# Patient Record
Sex: Female | Born: 1985
Health system: Southern US, Community
[De-identification: ages and names within clinical notes are randomized; demographics above are authoritative.]

## PROBLEM LIST (undated history)

## (undated) DIAGNOSIS — Z789 Other specified health status: Secondary | ICD-10-CM

## (undated) DIAGNOSIS — T7840XA Allergy, unspecified, initial encounter: Secondary | ICD-10-CM

## (undated) DIAGNOSIS — D649 Anemia, unspecified: Secondary | ICD-10-CM

## (undated) HISTORY — PX: WISDOM TOOTH EXTRACTION: SHX21

## (undated) HISTORY — DX: Anemia, unspecified: D64.9

## (undated) HISTORY — DX: Allergy, unspecified, initial encounter: T78.40XA

## (undated) HISTORY — DX: Other specified health status: Z78.9

---

## 2015-07-23 LAB — CBC AND DIFFERENTIAL
HCT: 37 (ref 36–46)
HEMATOCRIT: 37 (ref 36–46)
Hemoglobin: 12.9 (ref 12.0–16.0)
Hemoglobin: 12.9 (ref 12.0–16.0)
Neutrophils Absolute: 6
Platelets: 211 (ref 150–399)
Platelets: 211 (ref 150–399)
WBC: 8.3
WBC: 8.3

## 2015-07-23 LAB — HM PAP SMEAR: HM PAP: NEGATIVE

## 2015-07-23 LAB — HIV ANTIBODY (ROUTINE TESTING W REFLEX): HIV: NONREACTIVE

## 2015-08-18 LAB — POCT URINALYSIS DIPSTICK

## 2016-10-28 ENCOUNTER — Ambulatory Visit (INDEPENDENT_AMBULATORY_CARE_PROVIDER_SITE_OTHER): Payer: PRIVATE HEALTH INSURANCE | Admitting: Adult Health

## 2016-10-28 ENCOUNTER — Encounter: Payer: Self-pay | Admitting: Adult Health

## 2016-10-28 VITALS — BP 105/68 | HR 109 | Ht 70.0 in | Wt 135.1 lb

## 2016-10-28 DIAGNOSIS — O26819 Pregnancy related exhaustion and fatigue, unspecified trimester: Secondary | ICD-10-CM

## 2016-10-28 DIAGNOSIS — M25521 Pain in right elbow: Secondary | ICD-10-CM | POA: Diagnosis not present

## 2016-10-28 DIAGNOSIS — Z0000 Encounter for general adult medical examination without abnormal findings: Secondary | ICD-10-CM | POA: Diagnosis not present

## 2016-10-28 DIAGNOSIS — Z1389 Encounter for screening for other disorder: Secondary | ICD-10-CM | POA: Insufficient documentation

## 2016-10-28 NOTE — Patient Instructions (Signed)
Heart-Healthy Eating Plan Many factors influence your heart health, including eating and exercise habits. Heart (coronary) risk increases with abnormal blood fat (lipid) levels. Heart-healthy meal planning includes limiting unhealthy fats, increasing healthy fats, and making other small dietary changes. This includes maintaining a healthy body weight to help keep lipid levels within a normal range. What is my plan? Your health care provider recommends that you:  Get no more than __25__% of the total calories in your daily diet from fat.  Limit your intake of saturated fat to less than __5__% of your total calories each day.  Limit the amount of cholesterol in your diet to less than __300__ mg per day.  What types of fat should I choose?  Choose healthy fats more often. Choose monounsaturated and polyunsaturated fats, such as olive oil and canola oil, flaxseeds, walnuts, almonds, and seeds.  Eat more omega-3 fats. Good choices include salmon, mackerel, sardines, tuna, flaxseed oil, and ground flaxseeds. Aim to eat fish at least two times each week.  Limit saturated fats. Saturated fats are primarily found in animal products, such as meats, butter, and cream. Plant sources of saturated fats include palm oil, palm kernel oil, and coconut oil.  Avoid foods with partially hydrogenated oils in them. These contain trans fats. Examples of foods that contain trans fats are stick margarine, some tub margarines, cookies, crackers, and other baked goods. What general guidelines do I need to follow?  Check food labels carefully to identify foods with trans fats or high amounts of saturated fat.  Fill one half of your plate with vegetables and green salads. Eat 4-5 servings of vegetables per day. A serving of vegetables equals 1 cup of raw leafy vegetables,  cup of raw or cooked cut-up vegetables, or  cup of vegetable juice.  Fill one fourth of your plate with whole grains. Look for the word "whole" as  the first word in the ingredient list.  Fill one fourth of your plate with lean protein foods.  Eat 4-5 servings of fruit per day. A serving of fruit equals one medium whole fruit,  cup of dried fruit,  cup of fresh, frozen, or canned fruit, or  cup of 100% fruit juice.  Eat more foods that contain soluble fiber. Examples of foods that contain this type of fiber are apples, broccoli, carrots, beans, peas, and barley. Aim to get 20-30 g of fiber per day.  Eat more home-cooked food and less restaurant, buffet, and fast food.  Limit or avoid alcohol.  Limit foods that are high in starch and sugar.  Avoid fried foods.  Cook foods by using methods other than frying. Baking, boiling, grilling, and broiling are all great options. Other fat-reducing suggestions include: ? Removing the skin from poultry. ? Removing all visible fats from meats. ? Skimming the fat off of stews, soups, and gravies before serving them. ? Steaming vegetables in water or broth.  Lose weight if you are overweight. Losing just 5-10% of your initial body weight can help your overall health and prevent diseases such as diabetes and heart disease.  Increase your consumption of nuts, legumes, and seeds to 4-5 servings per week. One serving of dried beans or legumes equals  cup after being cooked, one serving of nuts equals 1 ounces, and one serving of seeds equals  ounce or 1 tablespoon.  You may need to monitor your salt (sodium) intake, especially if you have high blood pressure. Talk with your health care provider or dietitian to get  more information about reducing sodium. What foods can I eat? Grains  Breads, including [place], white, pita, wheat, raisin, rye, oatmeal, and [place]. Tortillas that are neither fried nor made with lard or trans fat. Low-fat rolls, including hotdog and hamburger buns and English muffins. Biscuits. Muffins. Waffles. Pancakes. Light popcorn. Whole-grain cereals. Flatbread. Melba toast.  Pretzels. Breadsticks. Rusks. Low-fat snacks and crackers, including oyster, saltine, matzo, graham, animal, and rye. Rice and pasta, including brown rice and those that are made with whole wheat. Vegetables All vegetables. Fruits All fruits, but limit coconut. Meats and Other Protein Sources Lean, well-trimmed beef, veal, pork, and lamb. Chicken and [place] without skin. All fish and shellfish. Wild duck, rabbit, pheasant, and venison. Egg whites or low-cholesterol egg substitutes. Dried beans, peas, lentils, and tofu.Seeds and most nuts. Dairy Low-fat or nonfat cheeses, including ricotta, string, and mozzarella. Skim or 1% milk that is liquid, powdered, or evaporated. Buttermilk that is made with low-fat milk. Nonfat or low-fat yogurt. Beverages Mineral water. Diet carbonated beverages. Sweets and Desserts Sherbets and fruit ices. Honey, jam, marmalade, jelly, and syrups. Meringues and gelatins. Pure sugar candy, such as hard candy, jelly beans, gumdrops, mints, marshmallows, and small amounts of dark chocolate. [employer] Eat all sweets and desserts in moderation. Fats and Oils Nonhydrogenated (trans-free) margarines. Vegetable oils, including soybean, sesame, sunflower, olive, peanut, safflower, corn, canola, and cottonseed. Salad dressings or mayonnaise that are made with a vegetable oil. Limit added fats and oils that you use for cooking, baking, salads, and as spreads. Other Cocoa powder. Coffee and tea. All seasonings and condiments. The items listed above may not be a complete list of recommended foods or beverages. Contact your dietitian for more options. What foods are not recommended? Grains Breads that are made with saturated or trans fats, oils, or whole milk. Croissants. Butter rolls. Cheese breads. Sweet rolls. Donuts. Buttered popcorn. Chow mein noodles. High-fat crackers, such as cheese or butter crackers. Meats and Other Protein Sources Fatty meats, such as hotdogs,  short ribs, sausage, spareribs, bacon, ribeye roast or steak, and mutton. High-fat deli meats, such as salami and bologna. Caviar. Domestic duck and goose. Organ meats, such as kidney, liver, sweetbreads, brains, gizzard, chitterlings, and heart. Dairy Cream, sour cream, cream cheese, and creamed cottage cheese. Whole milk cheeses, including blue (bleu), Monte[place] Jack, [place], [place], American, [place], Swiss, [place], [place], and [place]. Whole or 2% milk that is liquid, evaporated, or condensed. Whole buttermilk. Cream sauce or high-fat cheese sauce. Yogurt that is made from whole milk. Beverages Regular sodas and drinks with added sugar. Sweets and Desserts Frosting. Pudding. Cookies. Cakes other than angel food cake. Candy that has milk chocolate or white chocolate, hydrogenated fat, butter, coconut, or unknown ingredients. Buttered syrups. Full-fat ice cream or ice cream drinks. Fats and Oils Gravy that has suet, meat fat, or shortening. Cocoa butter, hydrogenated oils, palm oil, coconut oil, palm kernel oil. These can often be found in baked products, candy, fried foods, nondairy creamers, and whipped toppings. Solid fats and shortenings, including bacon fat, salt pork, lard, and butter. Nondairy cream substitutes, such as coffee creamers and sour cream substitutes. Salad dressings that are made of unknown oils, cheese, or sour cream. The items listed above may not be a complete list of foods and beverages to avoid. Contact your dietitian for more information. This information is not intended to replace advice given to you by your health care provider. Make sure you discuss any questions you have with your health care  provider. Document Released: 10/14/2007 Document Revised: 07/25/2015 Document Reviewed: 06/28/2013 Elsevier Interactive Patient Education  2017 [employer].  Overall you are doing great!!! Please schedule fasting labs at your convenience and full physical this fall. Referral to  OB/GYN placed. WELCOME TO THE PRACTICE!

## 2016-10-28 NOTE — Assessment & Plan Note (Addendum)
Apply ice for 20 mins several times daily Use OTC elbow sleeve Alternate holding son between each arm, not to over use R elbow.

## 2016-10-28 NOTE — Assessment & Plan Note (Addendum)
Continue excellent hydration and healthy eating. Will monitor wt, 1 degree family hx negative for ca. Please schedule fasting labs at your convenience and full physical this fall. Referral to OB/GYN placed.

## 2016-10-28 NOTE — Progress Notes (Signed)
Subjective:    Patient ID: [name], female    DOB: 1985/09/14, 31 y.o.   MRN: [id number]  HPI"  [name] is here to establish as a new pt.  She is a very pleasant 31 year female.  PMH:  Denies chronic medical conditions/daily rx medications.  She is a mother to a 60 month old- still breastfeeding. She feels that her overall health is good and has a a few concerns: 1) Unexpected wt loss- she is 10lbs under pre-birth wt and she is not trying to diet/loss wt.  She is breastfeeding everyday and eating less than her usual amt of food-motherhood!  1 degree family hx negative for ca. She denies fever/night sweats/NV/D 2) R elbow tenderness r/t to holding 20 lb son.  She has been using OTC "squeezer brace" on R FA- has not reduced pain. 3) Sharp sternal burning CP that often develops after eating.  She reports sx's will self resolve <1 min.  She denies 1 degree family hx of CAD/MI/HTN.  Patient Care Team    Relationship Specialty Notifications Start End  Julaine Fusi, NP PCP - General Family Medicine  10/28/16     Patient Active Problem List   Diagnosis Date Noted  . Healthcare maintenance 10/28/2016  . Right elbow pain 10/28/2016     History reviewed. No pertinent past medical history.   Past Surgical History:  Procedure Laterality Date  . CESAREAN SECTION    . WISDOM TOOTH EXTRACTION       Family History  Problem Relation Age of Onset  . Healthy Mother   . Healthy Father   . Healthy Sister   . Heart attack Maternal Grandfather   . Hyperlipidemia Maternal Grandfather   . Hypertension Maternal Grandfather   . Cancer Paternal Grandmother        lung  . Alcohol abuse Paternal Grandfather   . Healthy Sister      History  Drug Use No     History  Alcohol Use  . 0.6 oz/week  . 1 Glasses of wine per week     History  Smoking Status  . Former Smoker  . Packs/day: 0.25  . Years: 1.00  . Types: Cigarettes  . Quit date: 01/19/2008  Smokeless Tobacco   . Never Used     Outpatient Encounter Prescriptions as of 10/28/2016  Medication Sig  . Calcium Carb-Cholecalciferol (CALCIUM 1000 + D PO) Take 1 tablet by mouth 3 (three) times a week.  . ferrous gluconate (FERGON) 240 (27 FE) MG tablet Take 240 mg by mouth 3 (three) times a week.  Marland Kitchen levonorgestrel (MIRENA) 20 MCG/24HR IUD 1 each by Intrauterine route once.  . Multiple Vitamin (MULTIVITAMIN) tablet Take 1 tablet by mouth daily.   No facility-administered encounter medications on file as of 10/28/2016.     Allergies: Patient has no known allergies.  Body mass index is 19.38 kg/m.  Blood pressure 105/68, pulse (!) 109, height  (1.778 m), weight 135 lb 1.6 oz (61.3 kg).     Review of Systems  Constitutional: Positive for fatigue and unexpected weight change. Negative for activity change, appetite change, chills, diaphoresis and fever.  HENT: Negative for congestion.   Eyes: Negative for visual disturbance.  Respiratory: Positive for chest tightness. Negative for cough, shortness of breath, wheezing and stridor.   Cardiovascular: Negative for chest pain, palpitations and leg swelling.  Gastrointestinal: Negative for abdominal distention, abdominal pain, blood in stool, constipation, diarrhea, nausea and vomiting.  Occasional "gas pains"  Endocrine: Negative for cold intolerance, heat intolerance, polydipsia, polyphagia and polyuria.  Genitourinary: Negative for difficulty urinating, flank pain and hematuria.  Musculoskeletal: Positive for arthralgias. Negative for back pain, gait problem, joint swelling, myalgias, neck pain and neck stiffness.  Skin: Negative for color change, pallor, rash and wound.  Neurological: Negative for dizziness, tremors, weakness and headaches.  Hematological: Does not bruise/bleed easily.  Psychiatric/Behavioral: Negative for decreased concentration, hallucinations, self-injury, sleep disturbance and suicidal ideas. The patient is not  nervous/anxious and is not hyperactive.        Objective:   Physical Exam  Constitutional: She is oriented to person, place, and time. She appears well-developed and well-nourished. No distress.  HENT:  Head: Normocephalic and atraumatic.  Right Ear: External ear normal.  Left Ear: External ear normal.  Eyes: Pupils are equal, round, and reactive to light. Conjunctivae are normal.  Cardiovascular: Regular rhythm, normal heart sounds and intact distal pulses.  Tachycardia present.   No murmur heard. Pulmonary/Chest: Effort normal and breath sounds normal. No respiratory distress. She has no wheezes. She has no rales. She exhibits no tenderness.  Musculoskeletal: She exhibits tenderness.       Right shoulder: Normal.       Right elbow: She exhibits normal range of motion. Tenderness found.       Right wrist: Normal.  Neurological: She is alert and oriented to person, place, and time.  Skin: Skin is warm and dry. No rash noted. She is not diaphoretic. No erythema. No pallor.  Psychiatric: She has a normal mood and affect. Her behavior is normal. Judgment and thought content normal.  Nursing note and vitals reviewed.         Assessment & Plan:   1. Healthcare maintenance   2. Pregnancy related fatigue, antepartum   3. Right elbow pain     Healthcare maintenance Continue excellent hydration and healthy eating. Will monitor wt, 1 degree family hx negative for ca. Please schedule fasting labs at your convenience and full physical this fall. Referral to OB/GYN placed.   Right elbow pain Apply ice for 20 mins several times daily Use OTC elbow sleeve Alternate holding son between each arm, not to over use R elbow.    FOLLOW-UP:  Return in about 3 months (around 01/28/2017) for CPE.

## 2016-11-03 ENCOUNTER — Other Ambulatory Visit: Payer: PRIVATE HEALTH INSURANCE

## 2016-11-05 ENCOUNTER — Other Ambulatory Visit (INDEPENDENT_AMBULATORY_CARE_PROVIDER_SITE_OTHER): Payer: PRIVATE HEALTH INSURANCE

## 2016-11-05 VITALS — BP 100/62 | HR 87 | Temp 97.9°F

## 2016-11-05 DIAGNOSIS — Z23 Encounter for immunization: Secondary | ICD-10-CM | POA: Diagnosis not present

## 2016-11-05 DIAGNOSIS — O26819 Pregnancy related exhaustion and fatigue, unspecified trimester: Secondary | ICD-10-CM

## 2016-11-05 DIAGNOSIS — Z0000 Encounter for general adult medical examination without abnormal findings: Secondary | ICD-10-CM

## 2016-11-05 NOTE — Progress Notes (Signed)
Pt here for influenza vaccine.  Screening questionnaire reviewed, VIS provided to patient, and any/all patient questions answered.  T. Nelson, CMA  

## 2016-11-05 NOTE — Addendum Note (Signed)
Addended by: Stan HeadNELSON, TONYA S on: 11/05/2016 10:13 AM   Modules accepted: Orders

## 2016-11-06 LAB — CBC WITH DIFFERENTIAL/PLATELET
BASOS: 0 %
Basophils Absolute: 0 10*3/uL (ref 0.0–0.2)
EOS (ABSOLUTE): 0.1 10*3/uL (ref 0.0–0.4)
EOS: 2 %
HEMATOCRIT: 39.3 % (ref 34.0–46.6)
Hemoglobin: 12.8 g/dL (ref 11.1–15.9)
IMMATURE GRANULOCYTES: 0 %
Immature Grans (Abs): 0 10*3/uL (ref 0.0–0.1)
Lymphocytes Absolute: 2 10*3/uL (ref 0.7–3.1)
Lymphs: 37 %
MCH: 30.1 pg (ref 26.6–33.0)
MCHC: 32.6 g/dL (ref 31.5–35.7)
MCV: 93 fL (ref 79–97)
MONOS ABS: 0.3 10*3/uL (ref 0.1–0.9)
Monocytes: 5 %
NEUTROS ABS: 3 10*3/uL (ref 1.4–7.0)
NEUTROS PCT: 56 %
Platelets: 191 10*3/uL (ref 150–379)
RBC: 4.25 x10E6/uL (ref 3.77–5.28)
RDW: 14 % (ref 12.3–15.4)
WBC: 5.4 10*3/uL (ref 3.4–10.8)

## 2016-11-06 LAB — COMPREHENSIVE METABOLIC PANEL
A/G RATIO: 2.1 (ref 1.2–2.2)
ALT: 30 IU/L (ref 0–32)
AST: 25 IU/L (ref 0–40)
Albumin: 4.8 g/dL (ref 3.5–5.5)
Alkaline Phosphatase: 105 IU/L (ref 39–117)
BILIRUBIN TOTAL: 1.1 mg/dL (ref 0.0–1.2)
BUN/Creatinine Ratio: 18 (ref 9–23)
BUN: 15 mg/dL (ref 6–20)
CALCIUM: 9.6 mg/dL (ref 8.7–10.2)
CHLORIDE: 101 mmol/L (ref 96–106)
CO2: 24 mmol/L (ref 20–29)
Creatinine, Ser: 0.85 mg/dL (ref 0.57–1.00)
GFR, EST AFRICAN AMERICAN: 106 mL/min/{1.73_m2} (ref >59)
GFR, EST NON AFRICAN AMERICAN: 92 mL/min/{1.73_m2} (ref >59)
GLOBULIN, TOTAL: 2.3 g/dL (ref 1.5–4.5)
Glucose: 77 mg/dL (ref 65–99)
POTASSIUM: 4.2 mmol/L (ref 3.5–5.2)
Sodium: 140 mmol/L (ref 134–144)
TOTAL PROTEIN: 7.1 g/dL (ref 6.0–8.5)

## 2016-11-06 LAB — LIPID PANEL
Chol/HDL Ratio: 2.1 ratio (ref 0.0–4.4)
Cholesterol, Total: 145 mg/dL (ref 100–199)
HDL: 69 mg/dL (ref >39)
LDL CALC: 69 mg/dL (ref 0–99)
Triglycerides: 37 mg/dL (ref 0–149)
VLDL CHOLESTEROL CAL: 7 mg/dL (ref 5–40)

## 2016-11-06 LAB — HEMOGLOBIN A1C
Est. average glucose Bld gHb Est-mCnc: 100 mg/dL
Hgb A1c MFr Bld: 5.1 % (ref 4.8–5.6)

## 2016-11-06 LAB — TSH: TSH: 1.41 u[IU]/mL (ref 0.450–4.500)

## 2016-11-06 LAB — VITAMIN D 25 HYDROXY (VIT D DEFICIENCY, FRACTURES): VIT D 25 HYDROXY: 31.4 ng/mL (ref 30.0–100.0)

## 2017-02-01 NOTE — Progress Notes (Signed)
Subjective:    Patient ID: [name], female    DOB: 06/11/1985, 32 y.o.   MRN: [id number]  HPI: 10/28/16 OV:  [name] is here to establish as a new pt.  She is a very pleasant 32 year female.  PMH:  Denies chronic medical conditions/daily rx medications.  She is a mother to a 30 month old- still breastfeeding. She feels that her overall health is good and has a a few concerns: 1) Unexpected wt loss- she is 10lbs under pre-birth wt and she is not trying to diet/loss wt.  She is breastfeeding everyday and eating less than her usual amt of food-motherhood!  1 degree family hx negative for ca. She denies fever/night sweats/NV/D 2) R elbow tenderness r/t to holding 20 lb son.  She has been using OTC "squeezer brace" on R FA- has not reduced pain. 3) Sharp sternal burning CP that often develops after eating.  She reports sx's will self resolve <1 min.  She denies 1 degree family hx of CAD/MI/HTN.  02/02/17 OV: [name] is here for CPE. She has gained 7 lbs since last OV- GREAT! She is still breast feeding 3 times/day. She follows heart healthy diet and drinks 6-8 glasses water/day. She recently started walking program, 1-2 miles/day. Sternal CP has not occurred in months. Only concern today is erratic behavior and paranoid tendencies of her mother in law.  She is unaware of hx of mental illness in her husband's family.    Healthcare Maintenance: PAP- completed with OB/GY, not due for repeat Spring 2019 Mammogram-not indicated Colonoscopy-not indicated Influenza UTD  Patient Care Team    Relationship Specialty Notifications Start End  Julaine Fusi, NP PCP - General Family Medicine  10/28/16     Patient Active Problem List   Diagnosis Date Noted  . Stress at home 02/02/2017  . Healthcare maintenance 10/28/2016  . Right elbow pain 10/28/2016     History reviewed. No pertinent past medical history.   Past Surgical History:  Procedure Laterality Date  .  CESAREAN SECTION    . WISDOM TOOTH EXTRACTION       Family History  Problem Relation Age of Onset  . Healthy Mother   . Healthy Father   . Healthy Sister   . Heart attack Maternal Grandfather   . Hyperlipidemia Maternal Grandfather   . Hypertension Maternal Grandfather   . Cancer Paternal Grandmother        lung  . Alcohol abuse Paternal Grandfather   . Healthy Sister      Social History   Substance and Sexual Activity  Drug Use No     Social History   Substance and Sexual Activity  Alcohol Use Yes  . Alcohol/week: 0.6 oz  . Types: 1 Glasses of wine per week     Social History   Tobacco Use  Smoking Status Former Smoker  . Packs/day: 0.25  . Years: 1.00  . Pack years: 0.25  . Types: Cigarettes  . Last attempt to quit: 01/19/2008  . Years since quitting: 9.0  Smokeless Tobacco Never Used     Outpatient Encounter Medications as of 02/02/2017  Medication Sig  . Calcium Carb-Cholecalciferol (CALCIUM 1000 + D PO) Take 1 tablet by mouth 3 (three) times a week.  . ferrous gluconate (FERGON) 240 (27 FE) MG tablet Take 240 mg by mouth 3 (three) times a week.  Marland Kitchen levonorgestrel (MIRENA) 20 MCG/24HR IUD 1 each by Intrauterine route once.  . Multiple  Vitamin (MULTIVITAMIN) tablet Take 1 tablet by mouth daily.   No facility-administered encounter medications on file as of 02/02/2017.     Allergies: Patient has no known allergies.  Body mass index is 20.43 kg/m.  Blood pressure 106/71, pulse 98, height 5\' 10"  (1.778 m), weight 142 lb 6.4 oz (64.6 kg), SpO2 98 %.     Review of Systems  Constitutional: Positive for fatigue. Negative for activity change, appetite change, chills, diaphoresis, fever and unexpected weight change.  HENT: Negative for congestion.   Eyes: Negative for visual disturbance.  Respiratory: Negative for cough, chest tightness, shortness of breath, wheezing and stridor.   Cardiovascular: Negative for chest pain, palpitations and leg swelling.   Gastrointestinal: Negative for abdominal distention, abdominal pain, blood in stool, constipation, diarrhea, nausea and vomiting.  Endocrine: Negative for cold intolerance, heat intolerance, polydipsia, polyphagia and polyuria.  Genitourinary: Negative for difficulty urinating, flank pain and hematuria.  Musculoskeletal: Negative for arthralgias, back pain, gait problem, joint swelling, myalgias, neck pain and neck stiffness.  Skin: Negative for color change, pallor, rash and wound.  Neurological: Negative for dizziness, tremors, weakness and headaches.  Hematological: Does not bruise/bleed easily.  Psychiatric/Behavioral: Negative for decreased concentration, hallucinations, self-injury, sleep disturbance and suicidal ideas. The patient is not nervous/anxious and is not hyperactive.        Objective:   Physical Exam  Constitutional: She is oriented to person, place, and time. She appears well-developed and well-nourished. No distress.  HENT:  Head: Normocephalic and atraumatic.  Right Ear: Hearing, tympanic membrane, external ear and ear canal normal. Tympanic membrane is not erythematous and not bulging. No decreased hearing is noted.  Left Ear: Hearing, tympanic membrane, external ear and ear canal normal. Tympanic membrane is not erythematous and not bulging. No decreased hearing is noted.  Nose: Right sinus exhibits no maxillary sinus tenderness and no frontal sinus tenderness. Left sinus exhibits no maxillary sinus tenderness and no frontal sinus tenderness.  Mouth/Throat: Uvula is midline, oropharynx is clear and moist and mucous membranes are normal.  Eyes: Conjunctivae are normal. Pupils are equal, round, and reactive to light.  Neck: Normal range of motion.  Cardiovascular: Regular rhythm, normal heart sounds and intact distal pulses. Tachycardia present.  No murmur heard. Pulmonary/Chest: Effort normal and breath sounds normal. No respiratory distress. She has no wheezes. She has  no rales. She exhibits no tenderness.  Abdominal: Soft. Bowel sounds are normal. She exhibits no distension and no mass. There is no tenderness. There is no rebound and no guarding.  Musculoskeletal: She exhibits no tenderness.       Right shoulder: Normal.       Right elbow: Normal.      Right wrist: Normal.  Lymphadenopathy:    She has no cervical adenopathy.  Neurological: She is alert and oriented to person, place, and time.  Skin: Skin is warm and dry. No rash noted. She is not diaphoretic. No erythema. No pallor.  Psychiatric: She has a normal mood and affect. Her behavior is normal. Judgment and thought content normal.  Nursing note and vitals reviewed.         Assessment & Plan:   1. Stress at home   2. Healthcare maintenance     Healthcare maintenance You are doing great! Reviewed all labs from 10/2016, all WNL Continue your excellent water intake, healthy eating, and regular walking. Referral to mental health placed for you. Follow-up with OB/GYN for regular care, IUD removal when needed. Annual physical with labs. Call  us if you need anything!  Stress at home R/t mother in laws's erratic/paranoid behavior that has been escalating the last few years. She is unaware of any mental illness in husband's family. Referral placed for her to speak to therapist about these concerns and life in general. Recommended her to bring her concerns to her husband/family and that her mother in law should be evaluated by psychiatrist.     FOLLOW-UP:  Return in about 1 year (around 02/02/2018) for CPE, Fasting Labs.

## 2017-02-02 ENCOUNTER — Ambulatory Visit (INDEPENDENT_AMBULATORY_CARE_PROVIDER_SITE_OTHER): Payer: PRIVATE HEALTH INSURANCE | Admitting: Adult Health

## 2017-02-02 ENCOUNTER — Encounter: Payer: Self-pay | Admitting: Adult Health

## 2017-02-02 VITALS — BP 106/71 | HR 98 | Ht 70.0 in | Wt 142.4 lb

## 2017-02-02 DIAGNOSIS — Z0000 Encounter for general adult medical examination without abnormal findings: Secondary | ICD-10-CM | POA: Diagnosis not present

## 2017-02-02 DIAGNOSIS — F439 Reaction to severe stress, unspecified: Secondary | ICD-10-CM

## 2017-02-02 NOTE — Assessment & Plan Note (Signed)
R/t mother in laws's erratic/paranoid behavior that has been escalating the last few years. She is unaware of any mental illness in husband's family. Referral placed for her to speak to therapist about these concerns and life in general. Recommended her to bring her concerns to her husband/family and that her mother in law should be evaluated by psychiatrist.

## 2017-02-02 NOTE — Patient Instructions (Addendum)
Heart-Healthy Eating Plan Many factors influence your heart health, including eating and exercise habits. Heart (coronary) risk increases with abnormal blood fat (lipid) levels. Heart-healthy meal planning includes limiting unhealthy fats, increasing healthy fats, and making other small dietary changes. This includes maintaining a healthy body weight to help keep lipid levels within a normal range. What is my plan? Your health care provider recommends that you:  Get no more than ___25___% of the total calories in your daily diet from fat.  Limit your intake of saturated fat to less than ___5___% of your total calories each day.  Limit the amount of cholesterol in your diet to less than ___300__ mg per day.  What types of fat should I choose?  Choose healthy fats more often. Choose monounsaturated and polyunsaturated fats, such as olive oil and canola oil, flaxseeds, walnuts, almonds, and seeds.  Eat more omega-3 fats. Good choices include salmon, mackerel, sardines, tuna, flaxseed oil, and ground flaxseeds. Aim to eat fish at least two times each week.  Limit saturated fats. Saturated fats are primarily found in animal products, such as meats, butter, and cream. Plant sources of saturated fats include palm oil, palm kernel oil, and coconut oil.  Avoid foods with partially hydrogenated oils in them. These contain trans fats. Examples of foods that contain trans fats are stick margarine, some tub margarines, cookies, crackers, and other baked goods. What general guidelines do I need to follow?  Check food labels carefully to identify foods with trans fats or high amounts of saturated fat.  Fill one half of your plate with vegetables and green salads. Eat 4-5 servings of vegetables per day. A serving of vegetables equals 1 cup of raw leafy vegetables,  cup of raw or cooked cut-up vegetables, or  cup of vegetable juice.  Fill one fourth of your plate with whole grains. Look for the word  "whole" as the first word in the ingredient list.  Fill one fourth of your plate with lean protein foods.  Eat 4-5 servings of fruit per day. A serving of fruit equals one medium whole fruit,  cup of dried fruit,  cup of fresh, frozen, or canned fruit, or  cup of 100% fruit juice.  Eat more foods that contain soluble fiber. Examples of foods that contain this type of fiber are apples, broccoli, carrots, beans, peas, and barley. Aim to get 20-30 g of fiber per day.  Eat more home-cooked food and less restaurant, buffet, and fast food.  Limit or avoid alcohol.  Limit foods that are high in starch and sugar.  Avoid fried foods.  Cook foods by using methods other than frying. Baking, boiling, grilling, and broiling are all great options. Other fat-reducing suggestions include: ? Removing the skin from poultry. ? Removing all visible fats from meats. ? Skimming the fat off of stews, soups, and gravies before serving them. ? Steaming vegetables in water or broth.  Lose weight if you are overweight. Losing just 5-10% of your initial body weight can help your overall health and prevent diseases such as diabetes and heart disease.  Increase your consumption of nuts, legumes, and seeds to 4-5 servings per week. One serving of dried beans or legumes equals  cup after being cooked, one serving of nuts equals 1 ounces, and one serving of seeds equals  ounce or 1 tablespoon.  You may need to monitor your salt (sodium) intake, especially if you have high blood pressure. Talk with your health care provider or dietitian to get  more information about reducing sodium. What foods can I eat? Grains  Breads, including French, white, pita, wheat, raisin, rye, oatmeal, and Italian. Tortillas that are neither fried nor made with lard or trans fat. Low-fat rolls, including hotdog and hamburger buns and English muffins. Biscuits. Muffins. Waffles. Pancakes. Light popcorn. Whole-grain cereals. Flatbread.  Melba toast. Pretzels. Breadsticks. Rusks. Low-fat snacks and crackers, including oyster, saltine, matzo, graham, animal, and rye. Rice and pasta, including brown rice and those that are made with whole wheat. Vegetables All vegetables. Fruits All fruits, but limit coconut. Meats and Other Protein Sources Lean, well-trimmed beef, veal, pork, and lamb. Chicken and turkey without skin. All fish and shellfish. Wild duck, rabbit, pheasant, and venison. Egg whites or low-cholesterol egg substitutes. Dried beans, peas, lentils, and tofu.Seeds and most nuts. Dairy Low-fat or nonfat cheeses, including ricotta, string, and mozzarella. Skim or 1% milk that is liquid, powdered, or evaporated. Buttermilk that is made with low-fat milk. Nonfat or low-fat yogurt. Beverages Mineral water. Diet carbonated beverages. Sweets and Desserts Sherbets and fruit ices. Honey, jam, marmalade, jelly, and syrups. Meringues and gelatins. Pure sugar candy, such as hard candy, jelly beans, gumdrops, mints, marshmallows, and small amounts of dark chocolate. Angel food cake. Eat all sweets and desserts in moderation. Fats and Oils Nonhydrogenated (trans-free) margarines. Vegetable oils, including soybean, sesame, sunflower, olive, peanut, safflower, corn, canola, and cottonseed. Salad dressings or mayonnaise that are made with a vegetable oil. Limit added fats and oils that you use for cooking, baking, salads, and as spreads. Other Cocoa powder. Coffee and tea. All seasonings and condiments. The items listed above may not be a complete list of recommended foods or beverages. Contact your dietitian for more options. What foods are not recommended? Grains Breads that are made with saturated or trans fats, oils, or whole milk. Croissants. Butter rolls. Cheese breads. Sweet rolls. Donuts. Buttered popcorn. Chow mein noodles. High-fat crackers, such as cheese or butter crackers. Meats and Other Protein Sources Fatty meats, such  as hotdogs, short ribs, sausage, spareribs, bacon, ribeye roast or steak, and mutton. High-fat deli meats, such as salami and bologna. Caviar. Domestic duck and goose. Organ meats, such as kidney, liver, sweetbreads, brains, gizzard, chitterlings, and heart. Dairy Cream, sour cream, cream cheese, and creamed cottage cheese. Whole milk cheeses, including blue (bleu), Monterey Jack, Brie, [place], American, Havarti, Swiss, cheddar, Camembert, and [place]. Whole or 2% milk that is liquid, evaporated, or condensed. Whole buttermilk. Cream sauce or high-fat cheese sauce. Yogurt that is made from whole milk. Beverages Regular sodas and drinks with added sugar. Sweets and Desserts Frosting. Pudding. Cookies. Cakes other than angel food cake. Candy that has milk chocolate or white chocolate, hydrogenated fat, butter, coconut, or unknown ingredients. Buttered syrups. Full-fat ice cream or ice cream drinks. Fats and Oils Gravy that has suet, meat fat, or shortening. Cocoa butter, hydrogenated oils, palm oil, coconut oil, palm kernel oil. These can often be found in baked products, candy, fried foods, nondairy creamers, and whipped toppings. Solid fats and shortenings, including bacon fat, salt pork, lard, and butter. Nondairy cream substitutes, such as coffee creamers and sour cream substitutes. Salad dressings that are made of unknown oils, cheese, or sour cream. The items listed above may not be a complete list of foods and beverages to avoid. Contact your dietitian for more information. This information is not intended to replace advice given to you by your health care provider. Make sure you discuss any questions you have with your health care   provider. Document Released: 10/14/2007 Document Revised: 07/25/2015 Document Reviewed: 06/28/2013 Elsevier Interactive Patient Education  2018 [employer].   You are doing great! Continue your excellent water intake, healthy eating, and regular walking. Referral  to mental health placed for you. Follow-up with OB/GYN for regular care, IUD removal when needed. Annual physical with labs. Call us if you need anything! NICE TO SEE YOU!

## 2017-02-02 NOTE — Assessment & Plan Note (Signed)
You are doing great! Reviewed all labs from 10/2016, all WNL Continue your excellent water intake, healthy eating, and regular walking. Referral to mental health placed for you. Follow-up with OB/GYN for regular care, IUD removal when needed. Annual physical with labs. Call us if you need anything!

## 2017-02-09 ENCOUNTER — Encounter: Payer: Self-pay | Admitting: Adult Health

## 2017-02-16 ENCOUNTER — Ambulatory Visit: Payer: PRIVATE HEALTH INSURANCE | Admitting: Psychology

## 2017-02-25 ENCOUNTER — Ambulatory Visit: Payer: PRIVATE HEALTH INSURANCE | Admitting: Psychology

## 2017-02-25 DIAGNOSIS — F4322 Adjustment disorder with anxiety: Secondary | ICD-10-CM

## 2017-03-22 ENCOUNTER — Ambulatory Visit: Payer: PRIVATE HEALTH INSURANCE | Admitting: Psychology

## 2017-03-22 DIAGNOSIS — F4322 Adjustment disorder with anxiety: Secondary | ICD-10-CM

## 2017-04-26 ENCOUNTER — Ambulatory Visit: Payer: PRIVATE HEALTH INSURANCE | Admitting: Psychology

## 2017-04-26 DIAGNOSIS — F4322 Adjustment disorder with anxiety: Secondary | ICD-10-CM

## 2017-05-12 NOTE — Progress Notes (Signed)
Opened in error. T. Jackson Fetters, CMA 

## 2017-05-31 ENCOUNTER — Ambulatory Visit: Payer: Self-pay | Admitting: Psychology

## 2017-11-01 ENCOUNTER — Ambulatory Visit: Payer: PRIVATE HEALTH INSURANCE | Admitting: Psychology

## 2017-11-01 DIAGNOSIS — F4322 Adjustment disorder with anxiety: Secondary | ICD-10-CM

## 2017-11-04 ENCOUNTER — Ambulatory Visit (INDEPENDENT_AMBULATORY_CARE_PROVIDER_SITE_OTHER): Payer: PRIVATE HEALTH INSURANCE

## 2017-11-04 VITALS — BP 104/71 | HR 102

## 2017-11-04 DIAGNOSIS — Z23 Encounter for immunization: Secondary | ICD-10-CM | POA: Diagnosis not present

## 2017-11-04 NOTE — Progress Notes (Signed)
Pt here for influenza vaccine.  Screening questionnaire reviewed, VIS provided to patient, and any/all patient questions answered.  T. Sharone Almond, CMA  

## 2017-11-15 ENCOUNTER — Ambulatory Visit: Payer: PRIVATE HEALTH INSURANCE | Admitting: Psychology

## 2018-02-01 ENCOUNTER — Other Ambulatory Visit: Payer: Self-pay

## 2018-02-01 DIAGNOSIS — Z0000 Encounter for general adult medical examination without abnormal findings: Secondary | ICD-10-CM

## 2018-02-04 NOTE — Progress Notes (Signed)
Subjective:    Patient ID: [name], female    DOB: April 26, 1985, 33 y.o.   MRN: [id number]  HPI: 10/28/16 OV:  [name] is here to establish as a new pt.  She is a very pleasant 33 year female.  PMH:  Denies chronic medical conditions/daily rx medications.  She is a mother to a 67 month old- still breastfeeding. She feels that her overall health is good and has a a few concerns: 1) Unexpected wt loss- she is 10lbs under pre-birth wt and she is not trying to diet/loss wt.  She is breastfeeding everyday and eating less than her usual amt of food-motherhood!  1 degree family hx negative for ca. She denies fever/night sweats/NV/D 2) R elbow tenderness r/t to holding 20 lb son.  She has been using OTC "squeezer brace" on R FA- has not reduced pain. 3) Sharp sternal burning CP that often develops after eating.  She reports sx's will self resolve <1 min.  She denies 1 degree family hx of CAD/MI/HTN.  02/02/17 OV: [name] is here for CPE. She has gained 7 lbs since last OV- GREAT! She is still breast feeding 3 times/day. She follows heart healthy diet and drinks 6-8 glasses water/day. She recently started walking program, 1-2 miles/day. Sternal CP has not occurred in months. Only concern today is erratic behavior and paranoid tendencies of her mother in law.  She is unaware of hx of mental illness in her husband's family.     02/06/2018 OV: [name] is here for CPE She denies acute complaints today She estimates to drink 20-30oz water/day She follows a high protein, low sugar/CHO diet She remains active with "running after 33 year old" and [employer] She is still breastfeeding- 3 times/day She has OB/GYB appt next month- will update PAP and have IUD removed, trying for second child She continues to abstain from tobacco/vape use  Fasting labs, unable to obtain today- please schedule appt   Healthcare Maintenance: PAP-UTD, 02/2018, last normal Mammogram-not  indicated Colonoscopy-not indicated Immunizations-UTD  Patient Care Team    Relationship Specialty Notifications Start End  Julaine Fusi, NP PCP - General Family Medicine  10/28/16     Patient Active Problem List   Diagnosis Date Noted  . Stress at home 02/02/2017  . Healthcare maintenance 10/28/2016     History reviewed. No pertinent past medical history.   Past Surgical History:  Procedure Laterality Date  . CESAREAN SECTION    . WISDOM TOOTH EXTRACTION       Family History  Problem Relation Age of Onset  . Healthy Mother   . Healthy Father   . Healthy Sister   . Heart attack Maternal Grandfather   . Hyperlipidemia Maternal Grandfather   . Hypertension Maternal Grandfather   . Cancer Paternal Grandmother        lung  . Alcohol abuse Paternal Grandfather   . Healthy Sister      Social History   Substance and Sexual Activity  Drug Use No     Social History   Substance and Sexual Activity  Alcohol Use Yes  . Alcohol/week: 1.0 standard drinks  . Types: 1 Glasses of wine per week     Social History   Tobacco Use  Smoking Status Former Smoker  . Packs/day: 0.25  . Years: 1.00  . Pack years: 0.25  . Types: Cigarettes  . Last attempt to quit: 01/19/2008  . Years since quitting: 10.0  Smokeless  Tobacco Never Used     Outpatient Encounter Medications as of 02/06/2018  Medication Sig  . ferrous gluconate (FERGON) 240 (27 FE) MG tablet Take 240 mg by mouth 3 (three) times a week.  Marland Kitchen levonorgestrel (MIRENA) 20 MCG/24HR IUD 1 each by Intrauterine route once.  . Multiple Vitamin (MULTIVITAMIN) tablet Take 1 tablet by mouth daily.  . [DISCONTINUED] Calcium Carb-Cholecalciferol (CALCIUM 1000 + D PO) Take 1 tablet by mouth 3 (three) times a week.   No facility-administered encounter medications on file as of 02/06/2018.     Allergies: Patient has no known allergies.  Body mass index is 20.32 kg/m.  Blood pressure (!) 96/59, pulse 78, temperature  98.4 F (36.9 C), temperature source Oral, height 5\' 10"  (1.778 m), weight 141 lb 9.6 oz (64.2 kg), SpO2 98 %.  Review of Systems  Constitutional: Positive for fatigue. Negative for activity change, appetite change, chills, diaphoresis, fever and unexpected weight change.  HENT: Negative for congestion.   Eyes: Negative for visual disturbance.  Respiratory: Negative for cough, chest tightness, shortness of breath, wheezing and stridor.   Cardiovascular: Negative for chest pain, palpitations and leg swelling.  Gastrointestinal: Negative for abdominal distention, abdominal pain, blood in stool, constipation, diarrhea, nausea and vomiting.  Endocrine: Negative for cold intolerance, heat intolerance, polydipsia, polyphagia and polyuria.  Genitourinary: Negative for difficulty urinating, flank pain and hematuria.  Musculoskeletal: Negative for arthralgias, back pain, gait problem, joint swelling, myalgias, neck pain and neck stiffness.  Skin: Negative for color change, pallor, rash and wound.  Neurological: Negative for dizziness, tremors, weakness and headaches.  Hematological: Does not bruise/bleed easily.  Psychiatric/Behavioral: Negative for decreased concentration, hallucinations, self-injury, sleep disturbance and suicidal ideas. The patient is not nervous/anxious and is not hyperactive.        Objective:   Physical Exam  Constitutional: She is oriented to person, place, and time. She appears well-developed and well-nourished. No distress.  HENT:  Head: Normocephalic and atraumatic.  Right Ear: Hearing, tympanic membrane, external ear and ear canal normal. Tympanic membrane is not erythematous and not bulging. No decreased hearing is noted.  Left Ear: Hearing, tympanic membrane, external ear and ear canal normal. Tympanic membrane is not erythematous and not bulging. No decreased hearing is noted.  Nose: Right sinus exhibits no maxillary sinus tenderness and no frontal sinus tenderness.  Left sinus exhibits no maxillary sinus tenderness and no frontal sinus tenderness.  Mouth/Throat: Uvula is midline, oropharynx is clear and moist and mucous membranes are normal.  Eyes: Pupils are equal, round, and reactive to light. Conjunctivae and EOM are normal.  Neck: Normal range of motion. Neck supple.  Cardiovascular: Normal rate, regular rhythm and intact distal pulses. Exam reveals no gallop and no friction rub.  No murmur heard. Pulmonary/Chest: Effort normal and breath sounds normal. No respiratory distress. She has no wheezes. She has no rales. She exhibits no tenderness.  Abdominal: Soft. Bowel sounds are normal. She exhibits no distension and no mass. There is no abdominal tenderness. There is no rebound and no guarding.  Musculoskeletal:        General: No tenderness.     Right shoulder: Normal.     Right elbow: Normal.    Right wrist: Normal.  Lymphadenopathy:    She has no cervical adenopathy.  Neurological: She is alert and oriented to person, place, and time. Coordination normal.  Skin: Skin is warm and dry. No rash noted. She is not diaphoretic. No erythema. No pallor.  Psychiatric: She has a  normal mood and affect. Her behavior is normal. Judgment and thought content normal.  Nursing note and vitals reviewed.      Assessment & Plan:   1. Need for Tdap vaccination   2. Healthcare maintenance     Healthcare maintenance Overall you are doing a great job taking care of yourself! Increase water intake, strive for at least 75 ox/day Follow Mediterranean diet and remain as active as possible. Please schedule fasting lab appt at your convenience. Good luck with expending the family. Recommend annual physical with fasting labs.    FOLLOW-UP:  Return in about 1 year (around 02/07/2019) for CPE, Fasting Labs.

## 2018-02-06 ENCOUNTER — Ambulatory Visit (INDEPENDENT_AMBULATORY_CARE_PROVIDER_SITE_OTHER): Payer: PRIVATE HEALTH INSURANCE | Admitting: Adult Health

## 2018-02-06 ENCOUNTER — Encounter: Payer: Self-pay | Admitting: Adult Health

## 2018-02-06 VITALS — BP 96/59 | HR 78 | Temp 98.4°F | Ht 70.0 in | Wt 141.6 lb

## 2018-02-06 DIAGNOSIS — Z23 Encounter for immunization: Secondary | ICD-10-CM

## 2018-02-06 DIAGNOSIS — Z0000 Encounter for general adult medical examination without abnormal findings: Secondary | ICD-10-CM

## 2018-02-06 NOTE — Patient Instructions (Addendum)
Preventive Care for Adults, Female  A healthy lifestyle and preventive care can promote health and wellness. Preventive health guidelines for women include the following key practices.   A routine yearly physical is a good way to check with your health care provider about your health and preventive screening. It is a chance to share any concerns and updates on your health and to receive a thorough exam.   Visit your dentist for a routine exam and preventive care every 6 months. Brush your teeth twice a day and floss once a day. Good oral hygiene prevents tooth decay and gum disease.   The frequency of eye exams is based on your age, health, family medical history, use of contact lenses, and other factors. Follow your health care provider's recommendations for frequency of eye exams.   Eat a healthy diet. Foods like vegetables, fruits, whole grains, low-fat dairy products, and lean protein foods contain the nutrients you need without too many calories. Decrease your intake of foods high in solid fats, added sugars, and salt. Eat the right amount of calories for you.Get information about a proper diet from your health care provider, if necessary.   Regular physical exercise is one of the most important things you can do for your health. Most adults should get at least 150 minutes of moderate-intensity exercise (any activity that increases your heart rate and causes you to sweat) each week. In addition, most adults need muscle-strengthening exercises on 2 or more days a week.   Maintain a healthy weight. The body mass index (BMI) is a screening tool to identify possible weight problems. It provides an estimate of body fat based on height and weight. Your health care provider can find your BMI, and can help you achieve or maintain a healthy weight.For adults 20 years and older:   - A BMI below 18.5 is considered underweight.   - A BMI of 18.5 to 24.9 is normal.   - A BMI of 25 to 29.9 is  considered overweight.   - A BMI of 30 and above is considered obese.   Maintain normal blood lipids and cholesterol levels by exercising and minimizing your intake of trans and saturated fats.  Eat a balanced diet with plenty of fruit and vegetables. Blood tests for lipids and cholesterol should begin at age 20 and be repeated every 5 years minimum.  If your lipid or cholesterol levels are high, you are over 40, or you are at high risk for heart disease, you may need your cholesterol levels checked more frequently.Ongoing high lipid and cholesterol levels should be treated with medicines if diet and exercise are not working.   If you smoke, find out from your health care provider how to quit. If you do not use tobacco, do not start.   Lung cancer screening is recommended for adults aged 55-80 years who are at high risk for developing lung cancer because of a history of smoking. A yearly low-dose CT scan of the lungs is recommended for people who have at least a 30-pack-year history of smoking and are a current smoker or have quit within the past 15 years. A pack year of smoking is smoking an average of 1 pack of cigarettes a day for 1 year (for example: 1 pack a day for 30 years or 2 packs a day for 15 years). Yearly screening should continue until the smoker has stopped smoking for at least 15 years. Yearly screening should be stopped for people who develop a   health problem that would prevent them from having lung cancer treatment.   If you are pregnant, do not drink alcohol. If you are breastfeeding, be very cautious about drinking alcohol. If you are not pregnant and choose to drink alcohol, do not have more than 1 drink per day. One drink is considered to be 12 ounces (355 mL) of beer, 5 ounces (148 mL) of wine, or 1.5 ounces (44 mL) of liquor.   Avoid use of street drugs. Do not share needles with anyone. Ask for help if you need support or instructions about stopping the use of  drugs.   High blood pressure causes heart disease and increases the risk of stroke. Your blood pressure should be checked at least yearly.  Ongoing high blood pressure should be treated with medicines if weight loss and exercise do not work.   If you are 69-55 years old, ask your health care provider if you should take aspirin to prevent strokes.   Diabetes screening involves taking a blood sample to check your fasting blood sugar level. This should be done once every 3 years, after age 38, if you are within normal weight and without risk factors for diabetes. Testing should be considered at a younger age or be carried out more frequently if you are overweight and have at least 1 risk factor for diabetes.   Breast cancer screening is essential preventive care for women. You should practice "breast self-awareness."  This means understanding the normal appearance and feel of your breasts and may include breast self-examination.  Any changes detected, no matter how small, should be reported to a health care provider.  Women in their 80s and 30s should have a clinical breast exam (CBE) by a health care provider as part of a regular health exam every 1 to 3 years.  After age 66, women should have a CBE every year.  Starting at age 1, women should consider having a mammogram (breast X-ray test) every year.  Women who have a family history of breast cancer should talk to their health care provider about genetic screening.  Women at a high risk of breast cancer should talk to their health care providers about having an MRI and a mammogram every year.   -Breast cancer gene (BRCA)-related cancer risk assessment is recommended for women who have family members with BRCA-related cancers. BRCA-related cancers include breast, ovarian, tubal, and peritoneal cancers. Having family members with these cancers may be associated with an increased risk for harmful changes (mutations) in the breast cancer genes BRCA1 and  BRCA2. Results of the assessment will determine the need for genetic counseling and BRCA1 and BRCA2 testing.   The Pap test is a screening test for cervical cancer. A Pap test can show cell changes on the cervix that might become cervical cancer if left untreated. A Pap test is a procedure in which cells are obtained and examined from the lower end of the uterus (cervix).   - Women should have a Pap test starting at age 57.   - Between ages 90 and 70, Pap tests should be repeated every 2 years.   - Beginning at age 63, you should have a Pap test every 3 years as long as the past 3 Pap tests have been normal.   - Some women have medical problems that increase the chance of getting cervical cancer. Talk to your health care provider about these problems. It is especially important to talk to your health care provider if a  new problem develops soon after your last Pap test. In these cases, your health care provider may recommend more frequent screening and Pap tests.   - The above recommendations are the same for women who have or have not gotten the vaccine for human papillomavirus (HPV).   - If you had a hysterectomy for a problem that was not cancer or a condition that could lead to cancer, then you no longer need Pap tests. Even if you no longer need a Pap test, a regular exam is a good idea to make sure no other problems are starting.   - If you are between ages 36 and 66 years, and you have had normal Pap tests going back 10 years, you no longer need Pap tests. Even if you no longer need a Pap test, a regular exam is a good idea to make sure no other problems are starting.   - If you have had past treatment for cervical cancer or a condition that could lead to cancer, you need Pap tests and screening for cancer for at least 20 years after your treatment.   - If Pap tests have been discontinued, risk factors (such as a new sexual partner) need to be reassessed to determine if screening should  be resumed.   - The HPV test is an additional test that may be used for cervical cancer screening. The HPV test looks for the virus that can cause the cell changes on the cervix. The cells collected during the Pap test can be tested for HPV. The HPV test could be used to screen women aged 70 years and older, and should be used in women of any age who have unclear Pap test results. After the age of 67, women should have HPV testing at the same frequency as a Pap test.   Colorectal cancer can be detected and often prevented. Most routine colorectal cancer screening begins at the age of 57 years and continues through age 26 years. However, your health care provider may recommend screening at an earlier age if you have risk factors for colon cancer. On a yearly basis, your health care provider may provide home test kits to check for hidden blood in the stool.  Use of a small camera at the end of a tube, to directly examine the colon (sigmoidoscopy or colonoscopy), can detect the earliest forms of colorectal cancer. Talk to your health care provider about this at age 23, when routine screening begins. Direct exam of the colon should be repeated every 5 -10 years through age 49 years, unless early forms of pre-cancerous polyps or small growths are found.   People who are at an increased risk for hepatitis B should be screened for this virus. You are considered at high risk for hepatitis B if:  -You were born in a country where hepatitis B occurs often. Talk with your health care provider about which countries are considered high risk.  - Your parents were born in a high-risk country and you have not received a shot to protect against hepatitis B (hepatitis B vaccine).  - You have HIV or AIDS.  - You use needles to inject street drugs.  - You live with, or have sex with, someone who has Hepatitis B.  - You get hemodialysis treatment.  - You take certain medicines for conditions like cancer, organ  transplantation, and autoimmune conditions.   Hepatitis C blood testing is recommended for all people born from [birth year] through 1965 and any individual  with known risks for hepatitis C.   Practice safe sex. Use condoms and avoid high-risk sexual practices to reduce the spread of sexually transmitted infections (STIs). STIs include gonorrhea, chlamydia, syphilis, trichomonas, herpes, HPV, and human immunodeficiency virus (HIV). Herpes, HIV, and HPV are viral illnesses that have no cure. They can result in disability, cancer, and death. Sexually active women aged 25 years and younger should be checked for chlamydia. Older women with new or multiple partners should also be tested for chlamydia. Testing for other STIs is recommended if you are sexually active and at increased risk.   Osteoporosis is a disease in which the bones lose minerals and strength with aging. This can result in serious bone fractures or breaks. The risk of osteoporosis can be identified using a bone density scan. Women ages 65 years and over and women at risk for fractures or osteoporosis should discuss screening with their health care providers. Ask your health care provider whether you should take a calcium supplement or vitamin D to There are also several preventive steps women can take to avoid osteoporosis and resulting fractures or to keep osteoporosis from worsening. -->Recommendations include:  Eat a balanced diet high in fruits, vegetables, calcium, and vitamins.  Get enough calcium. The recommended total intake of is 1,200 mg daily; for best absorption, if taking supplements, divide doses into 250-500 mg doses throughout the day. Of the two types of calcium, calcium carbonate is best absorbed when taken with food but calcium citrate can be taken on an empty stomach.  Get enough vitamin D. NAMS and the National Osteoporosis Foundation recommend at least 1,000 IU per day for women age 50 and over who are at risk of vitamin D  deficiency. Vitamin D deficiency can be caused by inadequate sun exposure (for example, those who live in northern latitudes).  Avoid alcohol and smoking. Heavy alcohol intake (more than 7 drinks per week) increases the risk of falls and hip fracture and women smokers tend to lose bone more rapidly and have lower bone mass than nonsmokers. Stopping smoking is one of the most important changes women can make to improve their health and decrease risk for disease.  Be physically active every day. Weight-bearing exercise (for example, fast walking, hiking, jogging, and weight training) may strengthen bones or slow the rate of bone loss that comes with aging. Balancing and muscle-strengthening exercises can reduce the risk of falling and fracture.  Consider therapeutic medications. Currently, several types of effective drugs are available. Healthcare providers can recommend the type most appropriate for each woman.  Eliminate environmental factors that may contribute to accidents. Falls cause nearly 90% of all osteoporotic fractures, so reducing this risk is an important bone-health strategy. Measures include ample lighting, removing obstructions to walking, using nonskid rugs on floors, and placing mats and/or grab bars in showers.  Be aware of medication side effects. Some common medicines make bones weaker. These include a type of steroid drug called glucocorticoids used for arthritis and asthma, some antiseizure drugs, certain sleeping pills, treatments for endometriosis, and some cancer drugs. An overactive thyroid gland or using too much thyroid hormone for an underactive thyroid can also be a problem. If you are taking these medicines, talk to your doctor about what you can do to help protect your bones.reduce the rate of osteoporosis.    Menopause can be associated with physical symptoms and risks. Hormone replacement therapy is available to decrease symptoms and risks. You should talk to your  health care provider   about whether hormone replacement therapy is right for you.   Use sunscreen. Apply sunscreen liberally and repeatedly throughout the day. You should seek shade when your shadow is shorter than you. Protect yourself by wearing long sleeves, pants, a wide-brimmed hat, and sunglasses year round, whenever you are outdoors.   Once a month, do a whole body skin exam, using a mirror to look at the skin on your back. Tell your health care provider of new moles, moles that have irregular borders, moles that are larger than a pencil eraser, or moles that have changed in shape or color.   -Stay current with required vaccines (immunizations).   Influenza vaccine. All adults should be immunized every year.  Tetanus, diphtheria, and acellular pertussis (Td, Tdap) vaccine. Pregnant women should receive 1 dose of Tdap vaccine during each pregnancy. The dose should be obtained regardless of the length of time since the last dose. Immunization is preferred during the 27th 36th week of gestation. An adult who has not previously received Tdap or who does not know her vaccine status should receive 1 dose of Tdap. This initial dose should be followed by tetanus and diphtheria toxoids (Td) booster doses every 10 years. Adults with an unknown or incomplete history of completing a 3-dose immunization series with Td-containing vaccines should begin or complete a primary immunization series including a Tdap dose. Adults should receive a Td booster every 10 years.  Varicella vaccine. An adult without evidence of immunity to varicella should receive 2 doses or a second dose if she has previously received 1 dose. Pregnant females who do not have evidence of immunity should receive the first dose after pregnancy. This first dose should be obtained before leaving the health care facility. The second dose should be obtained 4 8 weeks after the first dose.  Human papillomavirus (HPV) vaccine. Females aged 13 26  years who have not received the vaccine previously should obtain the 3-dose series. The vaccine is not recommended for use in pregnant females. However, pregnancy testing is not needed before receiving a dose. If a female is found to be pregnant after receiving a dose, no treatment is needed. In that case, the remaining doses should be delayed until after the pregnancy. Immunization is recommended for any person with an immunocompromised condition through the age of 26 years if she did not get any or all doses earlier. During the 3-dose series, the second dose should be obtained 4 8 weeks after the first dose. The third dose should be obtained 24 weeks after the first dose and 16 weeks after the second dose.  Zoster vaccine. One dose is recommended for adults aged 60 years or older unless certain conditions are present.  Measles, mumps, and rubella (MMR) vaccine. Adults born before 1957 generally are considered immune to measles and mumps. Adults born in 1957 or later should have 1 or more doses of MMR vaccine unless there is a contraindication to the vaccine or there is laboratory evidence of immunity to each of the three diseases. A routine second dose of MMR vaccine should be obtained at least 28 days after the first dose for students attending postsecondary schools, health care workers, or international travelers. People who received inactivated measles vaccine or an unknown type of measles vaccine during 1963 1967 should receive 2 doses of MMR vaccine. People who received inactivated mumps vaccine or an unknown type of mumps vaccine before 1979 and are at high risk for mumps infection should consider immunization with 2 doses of   MMR vaccine. For females of childbearing age, rubella immunity should be determined. If there is no evidence of immunity, females who are not pregnant should be vaccinated. If there is no evidence of immunity, females who are pregnant should delay immunization until after pregnancy.  Unvaccinated health care workers born before [birth year] who lack laboratory evidence of measles, mumps, or rubella immunity or laboratory confirmation of disease should consider measles and mumps immunization with 2 doses of MMR vaccine or rubella immunization with 1 dose of MMR vaccine.  Pneumococcal 13-valent conjugate (PCV13) vaccine. When indicated, a person who is uncertain of her immunization history and has no record of immunization should receive the PCV13 vaccine. An adult aged 54 years or older who has certain medical conditions and has not been previously immunized should receive 1 dose of PCV13 vaccine. This PCV13 should be followed with a dose of pneumococcal polysaccharide (PPSV23) vaccine. The PPSV23 vaccine dose should be obtained at least 8 weeks after the dose of PCV13 vaccine. An adult aged 58 years or older who has certain medical conditions and previously received 1 or more doses of PPSV23 vaccine should receive 1 dose of PCV13. The PCV13 vaccine dose should be obtained 1 or more years after the last PPSV23 vaccine dose.  Pneumococcal polysaccharide (PPSV23) vaccine. When PCV13 is also indicated, PCV13 should be obtained first. All adults aged 58 years and older should be immunized. An adult younger than age 65 years who has certain medical conditions should be immunized. Any person who resides in a nursing home or long-term care facility should be immunized. An adult smoker should be immunized. People with an immunocompromised condition and certain other conditions should receive both PCV13 and PPSV23 vaccines. People with human immunodeficiency virus (HIV) infection should be immunized as soon as possible after diagnosis. Immunization during chemotherapy or radiation therapy should be avoided. Routine use of PPSV23 vaccine is not recommended for American Indians, [place] Natives, or people younger than 65 years unless there are medical conditions that require PPSV23 vaccine. When indicated,  people who have unknown immunization and have no record of immunization should receive PPSV23 vaccine. One-time revaccination 5 years after the first dose of PPSV23 is recommended for people aged 70 64 years who have chronic kidney failure, nephrotic syndrome, asplenia, or immunocompromised conditions. People who received 1 2 doses of PPSV23 before age 32 years should receive another dose of PPSV23 vaccine at age 96 years or later if at least 5 years have passed since the previous dose. Doses of PPSV23 are not needed for people immunized with PPSV23 at or after age 55 years.  Meningococcal vaccine. Adults with asplenia or persistent complement component deficiencies should receive 2 doses of quadrivalent meningococcal conjugate (MenACWY-D) vaccine. The doses should be obtained at least 2 months apart. Microbiologists working with certain meningococcal bacteria, [employer] recruits, people at risk during an outbreak, and people who travel to or live in countries with a high rate of meningitis should be immunized. A first-year college student up through age 58 years who is living in a residence hall should receive a dose if she did not receive a dose on or after her 16th birthday. Adults who have certain high-risk conditions should receive one or more doses of vaccine.  Hepatitis A vaccine. Adults who wish to be protected from this disease, have certain high-risk conditions, work with hepatitis A-infected animals, work in hepatitis A research labs, or travel to or work in countries with a high rate of hepatitis A should be  immunized. Adults who were previously unvaccinated and who anticipate close contact with an international adoptee during the first 60 days after arrival in the [place] from a country with a high rate of hepatitis A should be immunized.  Hepatitis B vaccine.  Adults who wish to be protected from this disease, have certain high-risk conditions, may be exposed to blood or other infectious  body fluids, are household contacts or sex partners of hepatitis B positive people, are clients or workers in certain care facilities, or travel to or work in countries with a high rate of hepatitis B should be immunized.  Haemophilus influenzae type b (Hib) vaccine. A previously unvaccinated person with asplenia or sickle cell disease or having a scheduled splenectomy should receive 1 dose of Hib vaccine. Regardless of previous immunization, a recipient of a hematopoietic stem cell transplant should receive a 3-dose series 6 12 months after her successful transplant. Hib vaccine is not recommended for adults with HIV infection.  Preventive Services / Frequency Ages 6 to 39years  Blood pressure check.** / Every 1 to 2 years.  Lipid and cholesterol check.** / Every 5 years beginning at age 39.  Clinical breast exam.** / Every 3 years for women in their 61s and 62s.  BRCA-related cancer risk assessment.** / For women who have family members with a BRCA-related cancer (breast, ovarian, tubal, or peritoneal cancers).  Pap test.** / Every 2 years from ages 47 through 85. Every 3 years starting at age 34 through age 12 or 74 with a history of 3 consecutive normal Pap tests.  HPV screening.** / Every 3 years from ages 46 through ages 43 to 54 with a history of 3 consecutive normal Pap tests.  Hepatitis C blood test.** / For any individual with known risks for hepatitis C.  Skin self-exam. / Monthly.  Influenza vaccine. / Every year.  Tetanus, diphtheria, and acellular pertussis (Tdap, Td) vaccine.** / Consult your health care provider. Pregnant women should receive 1 dose of Tdap vaccine during each pregnancy. 1 dose of Td every 10 years.  Varicella vaccine.** / Consult your health care provider. Pregnant females who do not have evidence of immunity should receive the first dose after pregnancy.  HPV vaccine. / 3 doses over 6 months, if 64 and younger. The vaccine is not recommended for use in  pregnant females. However, pregnancy testing is not needed before receiving a dose.  Measles, mumps, rubella (MMR) vaccine.** / You need at least 1 dose of MMR if you were born in 1957 or later. You may also need a 2nd dose. For females of childbearing age, rubella immunity should be determined. If there is no evidence of immunity, females who are not pregnant should be vaccinated. If there is no evidence of immunity, females who are pregnant should delay immunization until after pregnancy.  Pneumococcal 13-valent conjugate (PCV13) vaccine.** / Consult your health care provider.  Pneumococcal polysaccharide (PPSV23) vaccine.** / 1 to 2 doses if you smoke cigarettes or if you have certain conditions.  Meningococcal vaccine.** / 1 dose if you are age 71 to 37 years and a Market researcher living in a residence hall, or have one of several medical conditions, you need to get vaccinated against meningococcal disease. You may also need additional booster doses.  Hepatitis A vaccine.** / Consult your health care provider.  Hepatitis B vaccine.** / Consult your health care provider.  Haemophilus influenzae type b (Hib) vaccine.** / Consult your health care provider.  Ages 55 to 64years  Blood pressure check.** / Every 1 to 2 years.  Lipid and cholesterol check.** / Every 5 years beginning at age 20 years.  Lung cancer screening. / Every year if you are aged 55 80 years and have a 30-pack-year history of smoking and currently smoke or have quit within the past 15 years. Yearly screening is stopped once you have quit smoking for at least 15 years or develop a health problem that would prevent you from having lung cancer treatment.  Clinical breast exam.** / Every year after age 40 years.  BRCA-related cancer risk assessment.** / For women who have family members with a BRCA-related cancer (breast, ovarian, tubal, or peritoneal cancers).  Mammogram.** / Every year beginning at age 40  years and continuing for as long as you are in good health. Consult with your health care provider.  Pap test.** / Every 3 years starting at age 30 years through age 65 or 70 years with a history of 3 consecutive normal Pap tests.  HPV screening.** / Every 3 years from ages 30 years through ages 65 to 70 years with a history of 3 consecutive normal Pap tests.  Fecal occult blood test (FOBT) of stool. / Every year beginning at age 50 years and continuing until age 75 years. You may not need to do this test if you get a colonoscopy every 10 years.  Flexible sigmoidoscopy or colonoscopy.** / Every 5 years for a flexible sigmoidoscopy or every 10 years for a colonoscopy beginning at age 50 years and continuing until age 75 years.  Hepatitis C blood test.** / For all people born from 1945 through 1965 and any individual with known risks for hepatitis C.  Skin self-exam. / Monthly.  Influenza vaccine. / Every year.  Tetanus, diphtheria, and acellular pertussis (Tdap/Td) vaccine.** / Consult your health care provider. Pregnant women should receive 1 dose of Tdap vaccine during each pregnancy. 1 dose of Td every 10 years.  Varicella vaccine.** / Consult your health care provider. Pregnant females who do not have evidence of immunity should receive the first dose after pregnancy.  Zoster vaccine.** / 1 dose for adults aged 60 years or older.  Measles, mumps, rubella (MMR) vaccine.** / You need at least 1 dose of MMR if you were born in 1957 or later. You may also need a 2nd dose. For females of childbearing age, rubella immunity should be determined. If there is no evidence of immunity, females who are not pregnant should be vaccinated. If there is no evidence of immunity, females who are pregnant should delay immunization until after pregnancy.  Pneumococcal 13-valent conjugate (PCV13) vaccine.** / Consult your health care provider.  Pneumococcal polysaccharide (PPSV23) vaccine.** / 1 to 2 doses if  you smoke cigarettes or if you have certain conditions.  Meningococcal vaccine.** / Consult your health care provider.  Hepatitis A vaccine.** / Consult your health care provider.  Hepatitis B vaccine.** / Consult your health care provider.  Haemophilus influenzae type b (Hib) vaccine.** / Consult your health care provider.  Ages 65 years and over  Blood pressure check.** / Every 1 to 2 years.  Lipid and cholesterol check.** / Every 5 years beginning at age 20 years.  Lung cancer screening. / Every year if you are aged 55 80 years and have a 30-pack-year history of smoking and currently smoke or have quit within the past 15 years. Yearly screening is stopped once you have quit smoking for at least 15 years or develop a health problem that   would prevent you from having lung cancer treatment.  Clinical breast exam.** / Every year after age 103 years.  BRCA-related cancer risk assessment.** / For women who have family members with a BRCA-related cancer (breast, ovarian, tubal, or peritoneal cancers).  Mammogram.** / Every year beginning at age 36 years and continuing for as long as you are in good health. Consult with your health care provider.  Pap test.** / Every 3 years starting at age 5 years through age 85 or 10 years with 3 consecutive normal Pap tests. Testing can be stopped between 65 and 70 years with 3 consecutive normal Pap tests and no abnormal Pap or HPV tests in the past 10 years.  HPV screening.** / Every 3 years from ages 93 years through ages 70 or 45 years with a history of 3 consecutive normal Pap tests. Testing can be stopped between 65 and 70 years with 3 consecutive normal Pap tests and no abnormal Pap or HPV tests in the past 10 years.  Fecal occult blood test (FOBT) of stool. / Every year beginning at age 8 years and continuing until age 45 years. You may not need to do this test if you get a colonoscopy every 10 years.  Flexible sigmoidoscopy or colonoscopy.** /  Every 5 years for a flexible sigmoidoscopy or every 10 years for a colonoscopy beginning at age 69 years and continuing until age 68 years.  Hepatitis C blood test.** / For all people born from [birth year] through 1964 and any individual with known risks for hepatitis C.  Osteoporosis screening.** / A one-time screening for women ages 7 years and over and women at risk for fractures or osteoporosis.  Skin self-exam. / Monthly.  Influenza vaccine. / Every year.  Tetanus, diphtheria, and acellular pertussis (Tdap/Td) vaccine.** / 1 dose of Td every 10 years.  Varicella vaccine.** / Consult your health care provider.  Zoster vaccine.** / 1 dose for adults aged 5 years or older.  Pneumococcal 13-valent conjugate (PCV13) vaccine.** / Consult your health care provider.  Pneumococcal polysaccharide (PPSV23) vaccine.** / 1 dose for all adults aged 74 years and older.  Meningococcal vaccine.** / Consult your health care provider.  Hepatitis A vaccine.** / Consult your health care provider.  Hepatitis B vaccine.** / Consult your health care provider.  Haemophilus influenzae type b (Hib) vaccine.** / Consult your health care provider. ** Family history and personal history of risk and conditions may change your health care provider's recommendations. Document Released: 03/02/2001 Document Revised: 10/25/2012  [facility] Patient Information 2014 [place], [place].   EXERCISE AND DIET:  We recommended that you start or continue a regular exercise program for good health. Regular exercise means any activity that makes your heart beat faster and makes you sweat.  We recommend exercising at least 30 minutes per day at least 3 days a week, preferably 5.  We also recommend a diet low in fat and sugar / carbohydrates.  Inactivity, poor dietary choices and obesity can cause diabetes, heart attack, stroke, and kidney damage, among others.     ALCOHOL AND SMOKING:  Women should limit their alcohol intake to no  more than 7 drinks/beers/glasses of wine (combined, not each!) per week. Moderation of alcohol intake to this level decreases your risk of breast cancer and liver damage.  ( And of course, no recreational drugs are part of a healthy lifestyle.)  Also, you should not be smoking at all or even being exposed to second hand smoke. Most people know smoking can  cause cancer, and various heart and lung diseases, but did you know it also contributes to weakening of your bones?  Aging of your skin?  Yellowing of your teeth and nails?   CALCIUM AND VITAMIN D:  Adequate intake of calcium and Vitamin D are recommended.  The recommendations for exact amounts of these supplements seem to change often, but generally speaking 600 mg of calcium (either carbonate or citrate) and 800 units of Vitamin D per day seems prudent. Certain women may benefit from higher intake of Vitamin D.  If you are among these women, your doctor will have told you during your visit.     PAP SMEARS:  Pap smears, to check for cervical cancer or precancers,  have traditionally been done yearly, although recent scientific advances have shown that most women can have pap smears less often.  However, every woman still should have a physical exam from her gynecologist or primary care physician every year. It will include a breast check, inspection of the vulva and vagina to check for abnormal growths or skin changes, a visual exam of the cervix, and then an exam to evaluate the size and shape of the uterus and ovaries.  And after 32 years of age, a rectal exam is indicated to check for rectal cancers. We will also provide age appropriate advice regarding health maintenance, like when you should have certain vaccines, screening for sexually transmitted diseases, bone density testing, colonoscopy, mammograms, etc.    MAMMOGRAMS:  All women over 65 years old should have a yearly mammogram. Many facilities now offer a "3D" mammogram, which may cost  around $50 extra out of pocket. If possible,  we recommend you accept the option to have the 3D mammogram performed.  It both reduces the number of women who will be called back for extra views which then turn out to be normal, and it is better than the routine mammogram at detecting truly abnormal areas.     COLONOSCOPY:  Colonoscopy to screen for colon cancer is recommended for all women at age 26.  We know, you hate the idea of the prep.  We agree, BUT, having colon cancer and not knowing it is worse!!  Colon cancer so often starts as a polyp that can be seen and removed at colonscopy, which can quite literally save your life!  And if your first colonoscopy is normal and you have no family history of colon cancer, most women don't have to have it again for 10 years.  Once every ten years, you can do something that may end up saving your life, right?  We will be happy to help you get it scheduled when you are ready.  Be sure to check your insurance coverage so you understand how much it will cost.  It may be covered as a preventative service at no cost, but you should check your particular policy.    Mediterranean Diet A Mediterranean diet refers to food and lifestyle choices that are based on the traditions of countries located on the [employer]. This way of eating has been shown to help prevent certain conditions and improve outcomes for people who have chronic diseases, like kidney disease and heart disease. What are tips for following this plan? Lifestyle  Cook and eat meals together with your family, when possible.  Drink enough fluid to keep your urine clear or pale yellow.  Be physically active every day. This includes: ? Aerobic exercise like running or swimming. ? Leisure activities like  gardening, walking, or housework.  Get 7-8 hours of sleep each night.  If recommended by your health care provider, drink red wine in moderation. This means 1 glass a day for nonpregnant women  and 2 glasses a day for men. A glass of wine equals 5 oz (150 mL). Reading food labels   Check the serving size of packaged foods. For foods such as rice and pasta, the serving size refers to the amount of cooked product, not dry.  Check the total fat in packaged foods. Avoid foods that have saturated fat or trans fats.  Check the ingredients list for added sugars, such as corn syrup. Shopping  At the grocery store, buy most of your food from the areas near the walls of the store. This includes: ? Fresh fruits and vegetables (produce). ? Grains, beans, nuts, and seeds. Some of these may be available in unpackaged forms or large amounts (in bulk). ? Fresh seafood. ? Poultry and eggs. ? Low-fat dairy products.  Buy whole ingredients instead of prepackaged foods.  Buy fresh fruits and vegetables in-season from local farmers markets.  Buy frozen fruits and vegetables in resealable bags.  If you do not have access to quality fresh seafood, buy precooked frozen shrimp or canned fish, such as tuna, salmon, or sardines.  Buy small amounts of raw or cooked vegetables, salads, or olives from the deli or salad bar at your store.  Stock your pantry so you always have certain foods on hand, such as olive oil, canned tuna, canned tomatoes, rice, pasta, and beans. Cooking  Cook foods with extra-virgin olive oil instead of using butter or other vegetable oils.  Have meat as a side dish, and have vegetables or grains as your main dish. This means having meat in small portions or adding small amounts of meat to foods like pasta or stew.  Use beans or vegetables instead of meat in common dishes like chili or lasagna.  Experiment with different cooking methods. Try roasting or broiling vegetables instead of steaming or sauteing them.  Add frozen vegetables to soups, stews, pasta, or rice.  Add nuts or seeds for added healthy fat at each meal. You can add these to yogurt, salads, or vegetable  dishes.  Marinate fish or vegetables using olive oil, lemon juice, garlic, and fresh herbs. Meal planning   Plan to eat 1 vegetarian meal one day each week. Try to work up to 2 vegetarian meals, if possible.  Eat seafood 2 or more times a week.  Have healthy snacks readily available, such as: ? Vegetable sticks with hummus. ? [place] yogurt. ? Fruit and nut trail mix.  Eat balanced meals throughout the week. This includes: ? Fruit: 2-3 servings a day ? Vegetables: 4-5 servings a day ? Low-fat dairy: 2 servings a day ? Fish, poultry, or lean meat: 1 serving a day ? Beans and legumes: 2 or more servings a week ? Nuts and seeds: 1-2 servings a day ? Whole grains: 6-8 servings a day ? Extra-virgin olive oil: 3-4 servings a day  Limit red meat and sweets to only a few servings a month What are my food choices?  Mediterranean diet ? Recommended ? Grains: Whole-grain pasta. Brown rice. Bulgar wheat. Polenta. Couscous. Whole-wheat bread. [place]. ? Vegetables: Artichokes. Beets. Broccoli. Cabbage. Carrots. Eggplant. Green beans. Chard. Kale. Spinach. Onions. Leeks. Peas. Squash. Tomatoes. Peppers. Radishes. ? Fruits: Apples. Apricots. Avocado. Berries. Bananas. Cherries. Dates. Figs. Grapes. Lemons. Melon. Oranges. Peaches. Plums. Pomegranate. ? Meats and  other protein foods: Beans. Almonds. Sunflower seeds. Pine nuts. Peanuts. [place]. Salmon. Scallops. Shrimp. [place]. Tilapia. Clams. Oysters. Eggs. ? Dairy: Low-fat milk. Cheese. Greek yogurt. ? Beverages: Water. Red wine. Herbal tea. ? Fats and oils: Extra virgin olive oil. Avocado oil. Grape seed oil. ? Sweets and desserts: [place] yogurt with honey. Baked apples. Poached pears. Trail mix. ? Seasoning and other foods: Basil. Cilantro. Coriander. Cumin. Mint. Parsley. Sage. Rosemary. Tarragon. Garlic. Oregano. Thyme. Pepper. Balsalmic vinegar. Tahini. Hummus. Tomato sauce. Olives. Mushrooms. ? Limit these ? Grains: Prepackaged pasta  or rice dishes. Prepackaged cereal with added sugar. ? Vegetables: Deep fried potatoes (french fries). ? Fruits: Fruit canned in syrup. ? Meats and other protein foods: Beef. Pork. Lamb. Poultry with skin. Hot dogs. [place] Salines. ? Dairy: Ice cream. Sour cream. Whole milk. ? Beverages: Juice. Sugar-sweetened soft drinks. Beer. Liquor and spirits. ? Fats and oils: Butter. Canola oil. Vegetable oil. Beef fat (tallow). Lard. ? Sweets and desserts: Cookies. Cakes. Pies. Candy. ? Seasoning and other foods: Mayonnaise. Premade sauces and marinades. ? The items listed may not be a complete list. Talk with your dietitian about what dietary choices are right for you. Summary  The Mediterranean diet includes both food and lifestyle choices.  Eat a variety of fresh fruits and vegetables, beans, nuts, seeds, and whole grains.  Limit the amount of red meat and sweets that you eat.  Talk with your health care provider about whether it is safe for you to drink red wine in moderation. This means 1 glass a day for nonpregnant women and 2 glasses a day for men. A glass of wine equals 5 oz (150 mL). This information is not intended to replace advice given to you by your health care provider. Make sure you discuss any questions you have with your health care provider. Document Released: 08/28/2015 Document Revised: 09/30/2015 Document Reviewed: 08/28/2015 Elsevier Interactive Patient Education  2019 [employer].    Overall you are doing a great job taking care of yourself! Increase water intake, strive for at least 75 ox/day Follow Mediterranean diet and remain as active as possible. Please schedule fasting lab appt at your convenience. Good luck with expending the family. Recommend annual physical with fasting labs. GREAT TO SEE YOU!

## 2018-02-06 NOTE — Assessment & Plan Note (Signed)
Overall you are doing a great job taking care of yourself! Increase water intake, strive for at least 75 ox/day Follow Mediterranean diet and remain as active as possible. Please schedule fasting lab appt at your convenience. Good luck with expending the family. Recommend annual physical with fasting labs.

## 2018-02-07 ENCOUNTER — Encounter: Payer: Self-pay | Admitting: Adult Health

## 2018-02-20 ENCOUNTER — Other Ambulatory Visit: Payer: PRIVATE HEALTH INSURANCE

## 2018-02-20 DIAGNOSIS — Z0000 Encounter for general adult medical examination without abnormal findings: Secondary | ICD-10-CM

## 2018-02-21 ENCOUNTER — Telehealth: Payer: Self-pay | Admitting: Adult Health

## 2018-02-21 LAB — COMPREHENSIVE METABOLIC PANEL
ALK PHOS: 71 IU/L (ref 39–117)
ALT: 48 IU/L — AB (ref 0–32)
AST: 29 IU/L (ref 0–40)
Albumin/Globulin Ratio: 2.2 (ref 1.2–2.2)
Albumin: 4.4 g/dL (ref 3.8–4.8)
BUN/Creatinine Ratio: 19 (ref 9–23)
BUN: 17 mg/dL (ref 6–20)
Bilirubin Total: 0.6 mg/dL (ref 0.0–1.2)
CALCIUM: 9 mg/dL (ref 8.7–10.2)
CO2: 22 mmol/L (ref 20–29)
CREATININE: 0.91 mg/dL (ref 0.57–1.00)
Chloride: 102 mmol/L (ref 96–106)
GFR calc Af Amer: 97 mL/min/{1.73_m2} (ref >59)
GFR, EST NON AFRICAN AMERICAN: 84 mL/min/{1.73_m2} (ref >59)
GLUCOSE: 83 mg/dL (ref 65–99)
Globulin, Total: 2 g/dL (ref 1.5–4.5)
Potassium: 4 mmol/L (ref 3.5–5.2)
Sodium: 141 mmol/L (ref 134–144)
Total Protein: 6.4 g/dL (ref 6.0–8.5)

## 2018-02-21 LAB — CBC WITH DIFFERENTIAL/PLATELET
Basophils Absolute: 0 10*3/uL (ref 0.0–0.2)
Basos: 1 %
EOS (ABSOLUTE): 0.1 10*3/uL (ref 0.0–0.4)
Eos: 2 %
Hematocrit: 39.1 % (ref 34.0–46.6)
Hemoglobin: 13 g/dL (ref 11.1–15.9)
Immature Grans (Abs): 0 10*3/uL (ref 0.0–0.1)
Immature Granulocytes: 0 %
LYMPHS: 44 %
Lymphocytes Absolute: 2 10*3/uL (ref 0.7–3.1)
MCH: 30.2 pg (ref 26.6–33.0)
MCHC: 33.2 g/dL (ref 31.5–35.7)
MCV: 91 fL (ref 79–97)
Monocytes Absolute: 0.3 10*3/uL (ref 0.1–0.9)
Monocytes: 7 %
NEUTROS ABS: 2.1 10*3/uL (ref 1.4–7.0)
Neutrophils: 46 %
Platelets: 186 10*3/uL (ref 150–450)
RBC: 4.3 x10E6/uL (ref 3.77–5.28)
RDW: 12.2 % (ref 11.7–15.4)
WBC: 4.5 10*3/uL (ref 3.4–10.8)

## 2018-02-21 LAB — LIPID PANEL
Chol/HDL Ratio: 2.2 ratio (ref 0.0–4.4)
Cholesterol, Total: 131 mg/dL (ref 100–199)
HDL: 60 mg/dL (ref >39)
LDL Calculated: 63 mg/dL (ref 0–99)
Triglycerides: 39 mg/dL (ref 0–149)
VLDL Cholesterol Cal: 8 mg/dL (ref 5–40)

## 2018-02-21 LAB — HEMOGLOBIN A1C
ESTIMATED AVERAGE GLUCOSE: 100 mg/dL
Hgb A1c MFr Bld: 5.1 % (ref 4.8–5.6)

## 2018-02-21 LAB — TSH: TSH: 2.28 u[IU]/mL (ref 0.450–4.500)

## 2018-02-21 NOTE — Telephone Encounter (Signed)
Patient called states she has a few questions about Lab results to as medical assistant--- forwarding message to Archie Patten to call her @ [phone].  --glh

## 2018-02-21 NOTE — Telephone Encounter (Signed)
LVM for pt to call to discuss results.  T. Nelson, CMA  

## 2018-02-22 NOTE — Telephone Encounter (Signed)
Pt called back concerned about elevated LFTs.  She states that she does not use APAP or ETOH d/t breastfeeding.  Pt inquiring as to whether her iron supplement could be causing her elevated LFTs.  Per William Hamburger, if pt is only taking one tablet once daily of iron, then this should not causing elevated LFTs.  Advised pt that Orpha Bur is not concerned with her recent level, but will, however, repeat this at her next OV just to monitor.  Pt expressed understanding and is agreeable.  Tiajuana Amass, CMA

## 2018-02-22 NOTE — Telephone Encounter (Signed)
LVM for pt to call to discuss results.  T. Nelson, CMA  

## 2018-02-28 ENCOUNTER — Other Ambulatory Visit (HOSPITAL_COMMUNITY)
Admission: RE | Admit: 2018-02-28 | Discharge: 2018-02-28 | Disposition: A | Discharge status: Home / Self Care | Payer: PRIVATE HEALTH INSURANCE | Source: Ambulatory Visit | Attending: Obstetrics and Gynecology | Admitting: Obstetrics and Gynecology

## 2018-02-28 ENCOUNTER — Other Ambulatory Visit: Payer: Self-pay | Admitting: Obstetrics and Gynecology

## 2018-02-28 DIAGNOSIS — Z01419 Encounter for gynecological examination (general) (routine) without abnormal findings: Secondary | ICD-10-CM | POA: Insufficient documentation

## 2018-02-28 LAB — HM PAP SMEAR: HM Pap smear: NEGATIVE

## 2018-02-28 LAB — RESULTS CONSOLE HPV: CHL HPV: NEGATIVE

## 2018-03-02 LAB — CYTOLOGY - PAP
DIAGNOSIS: NEGATIVE
HPV: NOT DETECTED

## 2018-03-16 ENCOUNTER — Ambulatory Visit (INDEPENDENT_AMBULATORY_CARE_PROVIDER_SITE_OTHER): Payer: PRIVATE HEALTH INSURANCE | Admitting: Adult Health

## 2018-03-16 ENCOUNTER — Encounter: Payer: Self-pay | Admitting: Adult Health

## 2018-03-16 VITALS — BP 99/65 | HR 86 | Temp 98.8°F | Ht 70.0 in | Wt 146.0 lb

## 2018-03-16 DIAGNOSIS — J0100 Acute maxillary sinusitis, unspecified: Secondary | ICD-10-CM | POA: Diagnosis not present

## 2018-03-16 MED ORDER — FLUTICASONE PROPIONATE 50 MCG/ACT NA SUSP: 2.0000 | Freq: Every day | NASAL | 2 refills | Status: DC

## 2018-03-16 MED ORDER — AMOXICILLIN-POT CLAVULANATE 875-125 MG PO TABS: 1.0000 | ORAL_TABLET | Freq: Two times a day (BID) | ORAL | 0 refills | Status: DC

## 2018-03-16 NOTE — Progress Notes (Signed)
Subjective:    Patient ID: [name], female    DOB: 09-11-1985, 33 y.o.   MRN: [id number]  HPI:  [name] Presents with non-productive cough, yellow/green nasal drainage, post nasal gtt,low grade fever (highest 99.of), nausea without vomiting, and fatigue that started 2 weeks ago, initially improved then worsened 7 days ago. She has been pushing fluids and resting more. She denies tobacco/vape use  Patient Care Team    Relationship Specialty Notifications Start End  Julaine Fusi, NP PCP - General Family Medicine  10/28/16     Patient Active Problem List   Diagnosis Date Noted  . Acute maxillary sinusitis 03/16/2018  . Stress at home 02/02/2017  . Healthcare maintenance 10/28/2016     History reviewed. No pertinent past medical history.   Past Surgical History:  Procedure Laterality Date  . CESAREAN SECTION    . WISDOM TOOTH EXTRACTION       Family History  Problem Relation Age of Onset  . Healthy Mother   . Healthy Father   . Healthy Sister   . Heart attack Maternal Grandfather   . Hyperlipidemia Maternal Grandfather   . Hypertension Maternal Grandfather   . Cancer Paternal Grandmother        lung  . Alcohol abuse Paternal Grandfather   . Healthy Sister      Social History   Substance and Sexual Activity  Drug Use No     Social History   Substance and Sexual Activity  Alcohol Use Yes  . Alcohol/week: 1.0 standard drinks  . Types: 1 Glasses of wine per week     Social History   Tobacco Use  Smoking Status Former Smoker  . Packs/day: 0.25  . Years: 1.00  . Pack years: 0.25  . Types: Cigarettes  . Last attempt to quit: 01/19/2008  . Years since quitting: 10.1  Smokeless Tobacco Never Used     Outpatient Encounter Medications as of 03/16/2018  Medication Sig  . ferrous gluconate (FERGON) 240 (27 FE) MG tablet Take 240 mg by mouth 3 (three) times a week.  . Multiple Vitamin (MULTIVITAMIN) tablet Take 1 tablet by mouth daily.   Marland Kitchen amoxicillin-clavulanate (AUGMENTIN) 875-125 MG tablet Take 1 tablet by mouth 2 (two) times daily.  . fluticasone (FLONASE) 50 MCG/ACT nasal spray Place 2 sprays into both nostrils daily.  . [DISCONTINUED] levonorgestrel (MIRENA) 20 MCG/24HR IUD 1 each by Intrauterine route once.   No facility-administered encounter medications on file as of 03/16/2018.     Allergies: Patient has no known allergies.  Body mass index is 20.95 kg/m.  Blood pressure 99/65, pulse 86, temperature 98.8 F (37.1 C), temperature source Oral, height 5\' 10"  (1.778 m), weight 146 lb (66.2 kg), last menstrual period 03/03/2018, SpO2 98 %.  Review of Systems  Constitutional: Positive for activity change and fatigue. Negative for appetite change, chills, diaphoresis, fever and unexpected weight change.  HENT: Positive for congestion, ear pain, postnasal drip, rhinorrhea, sinus pressure, sinus pain, sneezing, sore throat and voice change. Negative for ear discharge.   Eyes: Negative for visual disturbance.  Respiratory: Positive for cough. Negative for chest tightness, shortness of breath, wheezing and stridor.   Cardiovascular: Negative for chest pain, palpitations and leg swelling.  Gastrointestinal: Positive for nausea. Negative for abdominal distention, abdominal pain, blood in stool, constipation and vomiting.  Endocrine: Negative for cold intolerance, heat intolerance, polydipsia, polyphagia and polyuria.  Genitourinary: Negative for difficulty urinating and flank pain.  Neurological: Negative for dizziness and  headaches.  Hematological: Does not bruise/bleed easily.       Objective:   Physical Exam Vitals signs and nursing note reviewed.  Constitutional:      Appearance: She is normal weight. She is ill-appearing.  HENT:     Head: Normocephalic and atraumatic.     Right Ear: Ear canal normal. Decreased hearing noted. Tympanic membrane is erythematous and bulging.     Left Ear: Decreased hearing noted.  Tympanic membrane is erythematous and bulging.     Nose:     Right Turbinates: Enlarged and swollen.     Left Turbinates: Enlarged and swollen.     Right Sinus: Maxillary sinus tenderness and frontal sinus tenderness present.     Left Sinus: Maxillary sinus tenderness and frontal sinus tenderness present.     Mouth/Throat:     Pharynx: Posterior oropharyngeal erythema present. No oropharyngeal exudate.     Tonsils: Swelling: 1+ on the right. 1+ on the left.  Eyes:     Extraocular Movements:     Right eye: Normal extraocular motion.     Left eye: Normal extraocular motion.     Conjunctiva/sclera: Conjunctivae normal.     Pupils: Pupils are equal, round, and reactive to light.  Neck:     Musculoskeletal: Normal range of motion and neck supple.  Cardiovascular:     Rate and Rhythm: Normal rate.     Heart sounds: Normal heart sounds. No murmur. No friction rub. No gallop.   Pulmonary:     Effort: Pulmonary effort is normal. No respiratory distress.     Breath sounds: Normal breath sounds. No stridor. No wheezing, rhonchi or rales.  Chest:     Chest wall: No tenderness.  Lymphadenopathy:     Cervical: No cervical adenopathy.  Skin:    General: Skin is warm and dry.     Capillary Refill: Capillary refill takes less than 2 seconds.  Neurological:     Mental Status: She is alert.  Psychiatric:        Mood and Affect: Mood normal.        Behavior: Behavior normal.       Assessment & Plan:   1. Acute maxillary sinusitis, recurrence not specified     Acute maxillary sinusitis Please use Augmentin and Flonase as directed. Increase fluids and rest. Alternate OTC Acetaminophen and Ibuprofen as directed for fever and discomfort. If symptoms persist after antibiotic completed, then please call clinic.    FOLLOW-UP:  Return if symptoms worsen or fail to improve.

## 2018-03-16 NOTE — Patient Instructions (Signed)
Sinusitis, Adult Sinusitis is inflammation of your sinuses. Sinuses are hollow spaces in the bones around your face. Your sinuses are located:  Around your eyes.  In the middle of your forehead.  Behind your nose.  In your cheekbones. Mucus normally drains out of your sinuses. When your nasal tissues become inflamed or swollen, mucus can become trapped or blocked. This allows bacteria, viruses, and fungi to grow, which leads to infection. Most infections of the sinuses are caused by a virus. Sinusitis can develop quickly. It can last for up to 4 weeks (acute) or for more than 12 weeks (chronic). Sinusitis often develops after a cold. What are the causes? This condition is caused by anything that creates swelling in the sinuses or stops mucus from draining. This includes:  Allergies.  Asthma.  Infection from bacteria or viruses.  Deformities or blockages in your nose or sinuses.  Abnormal growths in the nose (nasal polyps).  Pollutants, such as chemicals or irritants in the air.  Infection from fungi (rare). What increases the risk? You are more likely to develop this condition if you:  Have a weak body defense system (immune system).  Do a lot of swimming or diving.  Overuse nasal sprays.  Smoke. What are the signs or symptoms? The main symptoms of this condition are pain and a feeling of pressure around the affected sinuses. Other symptoms include:  Stuffy nose or congestion.  Thick drainage from your nose.  Swelling and warmth over the affected sinuses.  Headache.  Upper toothache.  A cough that may get worse at night.  Extra mucus that collects in the throat or the back of the nose (postnasal drip).  Decreased sense of smell and taste.  Fatigue.  A fever.  Sore throat.  Bad breath. How is this diagnosed? This condition is diagnosed based on:  Your symptoms.  Your medical history.  A physical exam.  Tests to find out if your condition is  acute or chronic. This may include: ? Checking your nose for nasal polyps. ? Viewing your sinuses using a device that has a light (endoscope). ? Testing for allergies or bacteria. ? Imaging tests, such as an MRI or CT scan. In rare cases, a bone biopsy may be done to rule out more serious types of fungal sinus disease. How is this treated? Treatment for sinusitis depends on the cause and whether your condition is chronic or acute.  If caused by a virus, your symptoms should go away on their own within 10 days. You may be given medicines to relieve symptoms. They include: ? Medicines that shrink swollen nasal passages (topical intranasal decongestants). ? Medicines that treat allergies (antihistamines). ? A spray that eases inflammation of the nostrils (topical intranasal corticosteroids). ? Rinses that help get rid of thick mucus in your nose (nasal saline washes).  If caused by bacteria, your health care provider may recommend waiting to see if your symptoms improve. Most bacterial infections will get better without antibiotic medicine. You may be given antibiotics if you have: ? A severe infection. ? A weak immune system.  If caused by narrow nasal passages or nasal polyps, you may need to have surgery. Follow these instructions at home: Medicines  Take, use, or apply over-the-counter and prescription medicines only as told by your health care provider. These may include nasal sprays.  If you were prescribed an antibiotic medicine, take it as told by your health care provider. Do not stop taking the antibiotic even if you start   to feel better. Hydrate and humidify   Drink enough fluid to keep your urine pale yellow. Staying hydrated will help to thin your mucus.  Use a cool mist humidifier to keep the humidity level in your home above 50%.  Inhale steam for 10-15 minutes, 3-4 times a day, or as told by your health care provider. You can do this in the bathroom while a hot shower is  running.  Limit your exposure to cool or dry air. Rest  Rest as much as possible.  Sleep with your head raised (elevated).  Make sure you get enough sleep each night. General instructions   Apply a warm, moist washcloth to your face 3-4 times a day or as told by your health care provider. This will help with discomfort.  Wash your hands often with soap and water to reduce your exposure to germs. If soap and water are not available, use hand sanitizer.  Do not smoke. Avoid being around people who are smoking (secondhand smoke).  Keep all follow-up visits as told by your health care provider. This is important. Contact a health care provider if:  You have a fever.  Your symptoms get worse.  Your symptoms do not improve within 10 days. Get help right away if:  You have a severe headache.  You have persistent vomiting.  You have severe pain or swelling around your face or eyes.  You have vision problems.  You develop confusion.  Your neck is stiff.  You have trouble breathing. Summary  Sinusitis is soreness and inflammation of your sinuses. Sinuses are hollow spaces in the bones around your face.  This condition is caused by nasal tissues that become inflamed or swollen. The swelling traps or blocks the flow of mucus. This allows bacteria, viruses, and fungi to grow, which leads to infection.  If you were prescribed an antibiotic medicine, take it as told by your health care provider. Do not stop taking the antibiotic even if you start to feel better.  Keep all follow-up visits as told by your health care provider. This is important. This information is not intended to replace advice given to you by your health care provider. Make sure you discuss any questions you have with your health care provider. Document Released: 01/04/2005 Document Revised: 06/06/2017 Document Reviewed: 06/06/2017 Elsevier Interactive Patient Education  2019 [employer]  Please use  Augmentin and Flonase as directed. Increase fluids and rest. Alternate OTC Acetaminophen and Ibuprofen as directed for fever and discomfort. If symptoms persist after antibiotic completed, then please call clinic. FEEL BETTER!

## 2018-03-16 NOTE — Assessment & Plan Note (Signed)
Please use Augmentin and Flonase as directed. Increase fluids and rest. Alternate OTC Acetaminophen and Ibuprofen as directed for fever and discomfort. If symptoms persist after antibiotic completed, then please call clinic.

## 2018-04-03 ENCOUNTER — Other Ambulatory Visit: Payer: Self-pay | Admitting: Obstetrics and Gynecology

## 2018-07-31 ENCOUNTER — Other Ambulatory Visit: Payer: Self-pay

## 2018-07-31 ENCOUNTER — Telehealth: Payer: Self-pay | Admitting: General Practice

## 2018-07-31 ENCOUNTER — Telehealth: Payer: Self-pay

## 2018-07-31 DIAGNOSIS — Z20822 Contact with and (suspected) exposure to covid-19: Secondary | ICD-10-CM

## 2018-07-31 DIAGNOSIS — R6889 Other general symptoms and signs: Secondary | ICD-10-CM | POA: Diagnosis not present

## 2018-07-31 NOTE — Telephone Encounter (Signed)
Pt has been scheduled for Covid testing.  °Pt was referred by: Danford, Katy D, NP °

## 2018-07-31 NOTE — Addendum Note (Signed)
Addended by: Denman George on: 07/31/2018 09:51 AM   Modules accepted: Orders

## 2018-07-31 NOTE — Telephone Encounter (Signed)
Pt calls c/o ST, nasal congestion, sinus pressure and chills.  Denies fever, cough, SOB.  Pt requests COVID testing.  Request for testing sent to [facility].  Charyl Bigger, CMA

## 2018-07-31 NOTE — Telephone Encounter (Signed)
Pt has been scheduled for Covid testing.  ° °

## 2018-08-02 NOTE — Progress Notes (Signed)
Subjective:    Patient ID: Melissa DominoAllison Vander Yu, female    DOB: 05/20/1985, 33 y.o.   MRN: [id number]  HPI:  [name] presents with dysuria and frequency that began 24 hrs ago. She denies flank pain/abdominal pain/N/V/D She denies fever/night sweats She reports hematuria, however she is having her menses. She has been taking OTC Azo and pushing water.  Of Note- at the end of the OV she mentions that she had COVID testing on Monday- results still pending.     Patient Care Team    Relationship Specialty Notifications Start End  Julaine Fusianford, Krystyne Tewksbury D, NP PCP - General Family Medicine  10/28/16     Patient Active Problem List   Diagnosis Date Noted  . Dysuria 08/03/2018  . Acute maxillary sinusitis 03/16/2018  . Stress at home 02/02/2017  . Healthcare maintenance 10/28/2016     History reviewed. No pertinent past medical history.   Past Surgical History:  Procedure Laterality Date  . CESAREAN SECTION    . WISDOM TOOTH EXTRACTION       Family History  Problem Relation Age of Onset  . Healthy Mother   . Healthy Father   . Healthy Sister   . Heart attack Maternal Grandfather   . Hyperlipidemia Maternal Grandfather   . Hypertension Maternal Grandfather   . Cancer Paternal Grandmother        lung  . Alcohol abuse Paternal Grandfather   . Healthy Sister      Social History   Substance and Sexual Activity  Drug Use No     Social History   Substance and Sexual Activity  Alcohol Use Yes  . Alcohol/week: 1.0 standard drinks  . Types: 1 Glasses of wine per week     Social History   Tobacco Use  Smoking Status Former Smoker  . Packs/day: 0.25  . Years: 1.00  . Pack years: 0.25  . Types: Cigarettes  . Quit date: 01/19/2008  . Years since quitting: 10.5  Smokeless Tobacco Never Used     Outpatient Encounter Medications as of 08/03/2018  Medication Sig  . ferrous gluconate (FERGON) 240 (27 FE) MG tablet Take 240 mg by mouth 3 (three) times a week.  .  Multiple Vitamin (MULTIVITAMIN) tablet Take 1 tablet by mouth daily.  . phenazopyridine (PYRIDIUM) 100 MG tablet Take 1 tablet (100 mg total) by mouth 3 (three) times daily as needed for pain.  . [DISCONTINUED] amoxicillin-clavulanate (AUGMENTIN) 875-125 MG tablet Take 1 tablet by mouth 2 (two) times daily.  . [DISCONTINUED] fluticasone (FLONASE) 50 MCG/ACT nasal spray Place 2 sprays into both nostrils daily.   No facility-administered encounter medications on file as of 08/03/2018.     Allergies: Patient has no known allergies.  There is no height or weight on file to calculate BMI.  Blood pressure 103/69, pulse 78, temperature 98.8 F (37.1 C), temperature source Oral, last menstrual period 07/28/2018, SpO2 100 %.    Review of Systems  Constitutional: Positive for fatigue. Negative for activity change, appetite change, chills, diaphoresis, fever and unexpected weight change.  HENT: Negative for congestion, sinus pressure and sore throat.   Eyes: Negative for visual disturbance.  Respiratory: Negative for cough, chest tightness, shortness of breath, wheezing and stridor.   Cardiovascular: Negative for chest pain, palpitations and leg swelling.  Gastrointestinal: Negative for abdominal distention, abdominal pain, blood in stool, constipation, diarrhea, nausea and vomiting.  Endocrine: Negative for cold intolerance, heat intolerance, polydipsia, polyphagia and polyuria.  Genitourinary: Positive for dysuria,  frequency and hematuria. Negative for difficulty urinating, flank pain and menstrual problem.  Musculoskeletal: Negative for arthralgias, back pain and myalgias.  Hematological: Negative for adenopathy. Does not bruise/bleed easily.       Objective:   Physical Exam Vitals signs and nursing note reviewed.  Constitutional:      General: She is not in acute distress.    Appearance: Normal appearance. She is not ill-appearing, toxic-appearing or diaphoretic.  HENT:     Head:  Normocephalic and atraumatic.  Eyes:     Extraocular Movements: Extraocular movements intact.     Conjunctiva/sclera: Conjunctivae normal.     Pupils: Pupils are equal, round, and reactive to light.  Cardiovascular:     Rate and Rhythm: Normal rate and regular rhythm.     Pulses: Normal pulses.     Heart sounds: Normal heart sounds. No murmur. No friction rub. No gallop.   Pulmonary:     Effort: Pulmonary effort is normal. No respiratory distress.     Breath sounds: Normal breath sounds. No stridor. No wheezing, rhonchi or rales.  Chest:     Chest wall: No tenderness.  Abdominal:     General: Abdomen is flat. Bowel sounds are normal. There is no distension.     Palpations: Abdomen is soft. There is no mass.     Tenderness: There is no abdominal tenderness. There is no right CVA tenderness, left CVA tenderness, guarding or rebound.     Hernia: No hernia is present.  Skin:    General: Skin is warm and dry.     Capillary Refill: Capillary refill takes less than 2 seconds.     Findings: No erythema.  Neurological:     Mental Status: She is alert and oriented to person, place, and time.  Psychiatric:        Mood and Affect: Mood normal.        Behavior: Behavior normal.        Thought Content: Thought content normal.        Judgment: Judgment normal.        Assessment & Plan:   1. Dysuria   2. Abnormal urinalysis   3. Healthcare maintenance     Dysuria UA- Blood: mod- currently on menses Nitrate: Neg Leu: Neg Urinalysis is negative for nitrates and leukocytes. Will send off for culture/sensitivity- will call with results. Please take Pyridium as directed.   Healthcare maintenance Remain home until you have COVID results.    FOLLOW-UP:  Return if symptoms worsen or fail to improve.

## 2018-08-03 ENCOUNTER — Encounter: Payer: Self-pay | Admitting: Adult Health

## 2018-08-03 ENCOUNTER — Ambulatory Visit (INDEPENDENT_AMBULATORY_CARE_PROVIDER_SITE_OTHER): Payer: BC Managed Care – PPO | Admitting: Adult Health

## 2018-08-03 ENCOUNTER — Other Ambulatory Visit: Payer: Self-pay

## 2018-08-03 VITALS — BP 103/69 | HR 78 | Temp 98.8°F

## 2018-08-03 DIAGNOSIS — R8290 Unspecified abnormal findings in urine: Secondary | ICD-10-CM

## 2018-08-03 DIAGNOSIS — R300 Dysuria: Secondary | ICD-10-CM | POA: Diagnosis not present

## 2018-08-03 DIAGNOSIS — Z0000 Encounter for general adult medical examination without abnormal findings: Secondary | ICD-10-CM

## 2018-08-03 LAB — POCT URINALYSIS DIPSTICK
Bilirubin, UA: NEGATIVE
Glucose, UA: NEGATIVE
Leukocytes, UA: NEGATIVE
Nitrite, UA: NEGATIVE
Protein, UA: NEGATIVE
Spec Grav, UA: >=1.03 — AB (ref 1.010–1.025)
Urobilinogen, UA: 0.2 E.U./dL
pH, UA: 5.5 (ref 5.0–8.0)

## 2018-08-03 MED ORDER — PHENAZOPYRIDINE HCL 100 MG PO TABS: 100.0000 mg | ORAL_TABLET | Freq: Three times a day (TID) | ORAL | 0 refills | Status: DC | PRN

## 2018-08-03 NOTE — Assessment & Plan Note (Addendum)
UA- Blood: mod- currently on menses Nitrate: Neg Leu: Neg Urinalysis is negative for nitrates and leukocytes. Will send off for culture/sensitivity- will call with results. Please take Pyridium as directed.

## 2018-08-03 NOTE — Patient Instructions (Addendum)
Dysuria Dysuria is pain or discomfort while urinating. The pain or discomfort may be felt in the part of your body that drains urine from the bladder (urethra) or in the surrounding tissue of the genitals. The pain may also be felt in the groin area, lower abdomen, or lower back. You may have to urinate frequently or have the sudden feeling that you have to urinate (urgency). Dysuria can affect both men and women, but it is more common in women. Dysuria can be caused by many different things, including:  Urinary tract infection.  Kidney stones or bladder stones.  Certain sexually transmitted infections (STIs), such as chlamydia.  Dehydration.  Inflammation of the tissues of the vagina.  Use of certain medicines.  Use of certain soaps or scented products that cause irritation. Follow these instructions at home: General instructions  Watch your condition for any changes.  Urinate often. Avoid holding urine for long periods of time.  After a bowel movement or urination, women should cleanse from front to back, using each tissue only once.  Urinate after sexual intercourse.  Keep all follow-up visits as told by your health care provider. This is important.  If you had any tests done to find the cause of dysuria, it is up to you to get your test results. Ask your health care provider, or the department that is doing the test, when your results will be ready. Eating and drinking   Drink enough fluid to keep your urine pale yellow.  Avoid caffeine, tea, and alcohol. They can irritate the bladder and make dysuria worse. In men, alcohol may irritate the prostate. Medicines  Take over-the-counter and prescription medicines only as told by your health care provider.  If you were prescribed an antibiotic medicine, take it as told by your health care provider. Do not stop taking the antibiotic even if you start to feel better. Contact a health care provider if:  You have a fever.   You develop pain in your back or sides.  You have nausea or vomiting.  You have blood in your urine.  You are not urinating as often as you usually do. Get help right away if:  Your pain is severe and not relieved with medicines.  You cannot eat or drink without vomiting.  You are confused.  You have a rapid heartbeat while at rest.  You have shaking or chills.  You feel extremely weak. Summary  Dysuria is pain or discomfort while urinating. Many different conditions can lead to dysuria.  If you have dysuria, you may have to urinate frequently or have the sudden feeling that you have to urinate (urgency).  Watch your condition for any changes. Keep all follow-up visits as told by your health care provider.  Make sure that you urinate often and drink enough fluid to keep your urine pale yellow. This information is not intended to replace advice given to you by your health care provider. Make sure you discuss any questions you have with your health care provider. Document Released: 10/03/2003 Document Revised: 12/17/2016 Document Reviewed: 10/21/2016 Elsevier Patient Education  [place].  Urinalysis is negative for nitrates and leukocytes. Will send off for culture/sensitivity- will call with results. Please take Pyridium as directed. Remain home until you have COVID results. FEEL BETTER!

## 2018-08-03 NOTE — Assessment & Plan Note (Signed)
Remain home until you have COVID results.

## 2018-08-04 LAB — NOVEL CORONAVIRUS, NAA: SARS-CoV-2, NAA: NOT DETECTED

## 2018-08-06 LAB — CULTURE, URINE COMPREHENSIVE

## 2018-10-03 DIAGNOSIS — Z3480 Encounter for supervision of other normal pregnancy, unspecified trimester: Secondary | ICD-10-CM | POA: Diagnosis not present

## 2018-10-06 LAB — OB RESULTS CONSOLE GC/CHLAMYDIA
Chlamydia: NEGATIVE
Gonorrhea: NEGATIVE

## 2018-10-06 LAB — OB RESULTS CONSOLE ABO/RH: RH Type: POSITIVE

## 2018-10-06 LAB — OB RESULTS CONSOLE RPR: RPR: NONREACTIVE

## 2018-10-06 LAB — OB RESULTS CONSOLE HEPATITIS B SURFACE ANTIGEN: Hepatitis B Surface Ag: NEGATIVE

## 2018-10-06 LAB — OB RESULTS CONSOLE HIV ANTIBODY (ROUTINE TESTING): HIV: NONREACTIVE

## 2018-10-06 LAB — OB RESULTS CONSOLE ANTIBODY SCREEN: Antibody Screen: NEGATIVE

## 2018-10-06 LAB — OB RESULTS CONSOLE RUBELLA ANTIBODY, IGM: Rubella: IMMUNE

## 2018-10-06 LAB — OB RESULTS CONSOLE GBS: GBS: POSITIVE

## 2018-10-17 DIAGNOSIS — Z3481 Encounter for supervision of other normal pregnancy, first trimester: Secondary | ICD-10-CM | POA: Diagnosis not present

## 2018-10-17 DIAGNOSIS — Z113 Encounter for screening for infections with a predominantly sexual mode of transmission: Secondary | ICD-10-CM | POA: Diagnosis not present

## 2018-10-18 DIAGNOSIS — D225 Melanocytic nevi of trunk: Secondary | ICD-10-CM | POA: Diagnosis not present

## 2018-10-18 DIAGNOSIS — D485 Neoplasm of uncertain behavior of skin: Secondary | ICD-10-CM | POA: Diagnosis not present

## 2018-10-18 DIAGNOSIS — D1801 Hemangioma of skin and subcutaneous tissue: Secondary | ICD-10-CM | POA: Diagnosis not present

## 2018-10-18 DIAGNOSIS — Z872 Personal history of diseases of the skin and subcutaneous tissue: Secondary | ICD-10-CM | POA: Diagnosis not present

## 2018-10-23 ENCOUNTER — Other Ambulatory Visit: Payer: Self-pay | Admitting: Obstetrics and Gynecology

## 2018-10-23 DIAGNOSIS — N6310 Unspecified lump in the right breast, unspecified quadrant: Secondary | ICD-10-CM

## 2018-10-23 DIAGNOSIS — N6320 Unspecified lump in the left breast, unspecified quadrant: Secondary | ICD-10-CM

## 2018-10-25 DIAGNOSIS — D485 Neoplasm of uncertain behavior of skin: Secondary | ICD-10-CM | POA: Diagnosis not present

## 2018-10-25 DIAGNOSIS — L905 Scar conditions and fibrosis of skin: Secondary | ICD-10-CM | POA: Diagnosis not present

## 2018-10-27 ENCOUNTER — Other Ambulatory Visit: Payer: BC Managed Care – PPO

## 2018-11-11 DIAGNOSIS — Z20828 Contact with and (suspected) exposure to other viral communicable diseases: Secondary | ICD-10-CM | POA: Diagnosis not present

## 2018-11-22 ENCOUNTER — Ambulatory Visit
Admission: RE | Admit: 2018-11-22 | Discharge: 2018-11-22 | Disposition: A | Discharge status: Home / Self Care | Payer: BC Managed Care – PPO | Source: Ambulatory Visit | Attending: Obstetrics and Gynecology | Admitting: Obstetrics and Gynecology

## 2018-11-22 ENCOUNTER — Other Ambulatory Visit: Payer: Self-pay

## 2018-11-22 DIAGNOSIS — N6320 Unspecified lump in the left breast, unspecified quadrant: Secondary | ICD-10-CM

## 2018-11-22 DIAGNOSIS — N6310 Unspecified lump in the right breast, unspecified quadrant: Secondary | ICD-10-CM

## 2018-11-22 DIAGNOSIS — N6489 Other specified disorders of breast: Secondary | ICD-10-CM | POA: Diagnosis not present

## 2018-12-20 DIAGNOSIS — Z3482 Encounter for supervision of other normal pregnancy, second trimester: Secondary | ICD-10-CM | POA: Diagnosis not present

## 2018-12-20 DIAGNOSIS — Z36 Encounter for antenatal screening for chromosomal anomalies: Secondary | ICD-10-CM | POA: Diagnosis not present

## 2019-02-06 DIAGNOSIS — Z113 Encounter for screening for infections with a predominantly sexual mode of transmission: Secondary | ICD-10-CM | POA: Diagnosis not present

## 2019-02-06 DIAGNOSIS — Z3482 Encounter for supervision of other normal pregnancy, second trimester: Secondary | ICD-10-CM | POA: Diagnosis not present

## 2019-02-07 ENCOUNTER — Other Ambulatory Visit: Payer: Self-pay

## 2019-02-07 DIAGNOSIS — Z0000 Encounter for general adult medical examination without abnormal findings: Secondary | ICD-10-CM

## 2019-02-08 ENCOUNTER — Other Ambulatory Visit: Payer: Self-pay

## 2019-02-09 ENCOUNTER — Other Ambulatory Visit: Payer: BC Managed Care – PPO

## 2019-02-12 ENCOUNTER — Encounter: Payer: Self-pay | Admitting: Adult Health

## 2019-02-20 DIAGNOSIS — Z23 Encounter for immunization: Secondary | ICD-10-CM | POA: Diagnosis not present

## 2019-03-27 DIAGNOSIS — R42 Dizziness and giddiness: Secondary | ICD-10-CM | POA: Diagnosis not present

## 2019-05-01 ENCOUNTER — Telehealth (HOSPITAL_COMMUNITY): Payer: Self-pay | Admitting: *Deleted

## 2019-05-01 ENCOUNTER — Encounter (HOSPITAL_COMMUNITY): Payer: Self-pay | Admitting: *Deleted

## 2019-05-01 NOTE — Telephone Encounter (Signed)
Preadmission screen  

## 2019-05-02 ENCOUNTER — Other Ambulatory Visit (HOSPITAL_COMMUNITY): Admission: RE | Admit: 2019-05-02 | Payer: BC Managed Care – PPO | Source: Ambulatory Visit

## 2019-05-02 DIAGNOSIS — O26843 Uterine size-date discrepancy, third trimester: Secondary | ICD-10-CM | POA: Diagnosis not present

## 2019-05-03 ENCOUNTER — Encounter (HOSPITAL_COMMUNITY): Payer: Self-pay | Admitting: *Deleted

## 2019-05-03 ENCOUNTER — Telehealth (HOSPITAL_COMMUNITY): Payer: Self-pay | Admitting: *Deleted

## 2019-05-03 NOTE — Telephone Encounter (Signed)
Preadmission screen  

## 2019-05-04 ENCOUNTER — Inpatient Hospital Stay (HOSPITAL_COMMUNITY): Payer: BC Managed Care – PPO

## 2019-05-04 DIAGNOSIS — Z3483 Encounter for supervision of other normal pregnancy, third trimester: Secondary | ICD-10-CM | POA: Diagnosis not present

## 2019-05-09 ENCOUNTER — Other Ambulatory Visit (HOSPITAL_COMMUNITY): Admission: RE | Admit: 2019-05-09 | Payer: BC Managed Care – PPO | Source: Ambulatory Visit

## 2019-05-10 ENCOUNTER — Telehealth (HOSPITAL_COMMUNITY): Payer: Self-pay | Admitting: *Deleted

## 2019-05-10 ENCOUNTER — Other Ambulatory Visit (HOSPITAL_COMMUNITY)
Admission: RE | Admit: 2019-05-10 | Discharge: 2019-05-10 | Disposition: A | Discharge status: Home / Self Care | Payer: BC Managed Care – PPO | Source: Ambulatory Visit | Attending: Obstetrics and Gynecology | Admitting: Obstetrics and Gynecology

## 2019-05-10 ENCOUNTER — Other Ambulatory Visit: Payer: Self-pay | Admitting: Obstetrics and Gynecology

## 2019-05-10 DIAGNOSIS — N6320 Unspecified lump in the left breast, unspecified quadrant: Secondary | ICD-10-CM | POA: Diagnosis not present

## 2019-05-10 DIAGNOSIS — Z98891 History of uterine scar from previous surgery: Secondary | ICD-10-CM | POA: Diagnosis not present

## 2019-05-10 DIAGNOSIS — O480 Post-term pregnancy: Secondary | ICD-10-CM | POA: Diagnosis not present

## 2019-05-10 DIAGNOSIS — Z87891 Personal history of nicotine dependence: Secondary | ICD-10-CM | POA: Diagnosis not present

## 2019-05-10 DIAGNOSIS — Z20822 Contact with and (suspected) exposure to covid-19: Secondary | ICD-10-CM | POA: Diagnosis not present

## 2019-05-10 DIAGNOSIS — Z01812 Encounter for preprocedural laboratory examination: Secondary | ICD-10-CM | POA: Insufficient documentation

## 2019-05-10 DIAGNOSIS — O34211 Maternal care for low transverse scar from previous cesarean delivery: Secondary | ICD-10-CM | POA: Diagnosis not present

## 2019-05-10 DIAGNOSIS — Z3A40 40 weeks gestation of pregnancy: Secondary | ICD-10-CM | POA: Diagnosis not present

## 2019-05-10 DIAGNOSIS — O99824 Streptococcus B carrier state complicating childbirth: Secondary | ICD-10-CM | POA: Diagnosis not present

## 2019-05-10 DIAGNOSIS — O26893 Other specified pregnancy related conditions, third trimester: Secondary | ICD-10-CM | POA: Diagnosis not present

## 2019-05-10 DIAGNOSIS — O9902 Anemia complicating childbirth: Secondary | ICD-10-CM | POA: Diagnosis not present

## 2019-05-10 DIAGNOSIS — D649 Anemia, unspecified: Secondary | ICD-10-CM | POA: Diagnosis not present

## 2019-05-10 LAB — SARS CORONAVIRUS 2 (TAT 6-24 HRS): SARS Coronavirus 2: NEGATIVE

## 2019-05-10 NOTE — Telephone Encounter (Signed)
Pt forgot to go for covid test yesterday.  Scheduled for now and instructed pt to go to test site now.  Verbalized understanding and site notified.

## 2019-05-11 ENCOUNTER — Encounter (HOSPITAL_COMMUNITY): Payer: Self-pay | Admitting: Obstetrics and Gynecology

## 2019-05-11 ENCOUNTER — Other Ambulatory Visit: Payer: Self-pay

## 2019-05-11 ENCOUNTER — Inpatient Hospital Stay (HOSPITAL_COMMUNITY)
Admission: AD | Admit: 2019-05-11 | Discharge: 2019-05-13 | DRG: 788 | Disposition: A | Discharge status: Home / Self Care | Payer: BC Managed Care – PPO | Attending: Obstetrics and Gynecology | Admitting: Obstetrics and Gynecology

## 2019-05-11 ENCOUNTER — Inpatient Hospital Stay (HOSPITAL_COMMUNITY): Payer: BC Managed Care – PPO | Admitting: Anesthesiology

## 2019-05-11 ENCOUNTER — Inpatient Hospital Stay (HOSPITAL_COMMUNITY): Payer: BC Managed Care – PPO

## 2019-05-11 ENCOUNTER — Encounter (HOSPITAL_COMMUNITY)
Admission: AD | Disposition: A | Discharge status: Home / Self Care | Payer: Self-pay | Source: Home / Self Care | Attending: Obstetrics and Gynecology

## 2019-05-11 DIAGNOSIS — O34211 Maternal care for low transverse scar from previous cesarean delivery: Secondary | ICD-10-CM | POA: Diagnosis present

## 2019-05-11 DIAGNOSIS — N6320 Unspecified lump in the left breast, unspecified quadrant: Secondary | ICD-10-CM | POA: Diagnosis present

## 2019-05-11 DIAGNOSIS — D649 Anemia, unspecified: Secondary | ICD-10-CM | POA: Diagnosis present

## 2019-05-11 DIAGNOSIS — O99824 Streptococcus B carrier state complicating childbirth: Secondary | ICD-10-CM | POA: Diagnosis present

## 2019-05-11 DIAGNOSIS — Z98891 History of uterine scar from previous surgery: Secondary | ICD-10-CM | POA: Diagnosis not present

## 2019-05-11 DIAGNOSIS — O26893 Other specified pregnancy related conditions, third trimester: Secondary | ICD-10-CM | POA: Diagnosis present

## 2019-05-11 DIAGNOSIS — Z20822 Contact with and (suspected) exposure to covid-19: Secondary | ICD-10-CM | POA: Diagnosis present

## 2019-05-11 DIAGNOSIS — Z87891 Personal history of nicotine dependence: Secondary | ICD-10-CM | POA: Diagnosis not present

## 2019-05-11 DIAGNOSIS — Z3490 Encounter for supervision of normal pregnancy, unspecified, unspecified trimester: Secondary | ICD-10-CM

## 2019-05-11 DIAGNOSIS — Z3A40 40 weeks gestation of pregnancy: Secondary | ICD-10-CM

## 2019-05-11 DIAGNOSIS — O9902 Anemia complicating childbirth: Secondary | ICD-10-CM | POA: Diagnosis present

## 2019-05-11 DIAGNOSIS — O480 Post-term pregnancy: Secondary | ICD-10-CM | POA: Diagnosis present

## 2019-05-11 LAB — CBC
HCT: 34.9 % — ABNORMAL LOW (ref 36.0–46.0)
Hemoglobin: 11.5 g/dL — ABNORMAL LOW (ref 12.0–15.0)
MCH: 31.3 pg (ref 26.0–34.0)
MCHC: 33 g/dL (ref 30.0–36.0)
MCV: 95.1 fL (ref 80.0–100.0)
Platelets: 193 10*3/uL (ref 150–400)
RBC: 3.67 MIL/uL — ABNORMAL LOW (ref 3.87–5.11)
RDW: 13.6 % (ref 11.5–15.5)
WBC: 10.8 10*3/uL — ABNORMAL HIGH (ref 4.0–10.5)
nRBC: 0 % (ref 0.0–0.2)

## 2019-05-11 LAB — ABO/RH: ABO/RH(D): O POS

## 2019-05-11 LAB — TYPE AND SCREEN
ABO/RH(D): O POS
Antibody Screen: NEGATIVE

## 2019-05-11 LAB — RPR: RPR Ser Ql: NONREACTIVE

## 2019-05-11 SURGERY — Surgical Case
Anesthesia: Spinal | Site: Abdomen | Wound class: Clean Contaminated

## 2019-05-11 MED ORDER — HYDROMORPHONE HCL 1 MG/ML IJ SOLN: 0.2000 mg | INTRAMUSCULAR | Status: DC | PRN

## 2019-05-11 MED ORDER — HYDROXYZINE HCL 50 MG PO TABS: 50.0000 mg | ORAL_TABLET | Freq: Four times a day (QID) | ORAL | Status: DC | PRN

## 2019-05-11 MED ORDER — OXYCODONE-ACETAMINOPHEN 5-325 MG PO TABS: 2.0000 | ORAL_TABLET | ORAL | Status: DC | PRN

## 2019-05-11 MED ORDER — PHENYLEPHRINE HCL-NACL 20-0.9 MG/250ML-% IV SOLN
INTRAVENOUS | Status: DC | PRN
  Administered 2019-05-11: 60 ug/min via INTRAVENOUS

## 2019-05-11 MED ORDER — ONDANSETRON HCL 4 MG/2ML IJ SOLN
INTRAMUSCULAR | Status: DC | PRN
  Administered 2019-05-11: 4 mg via INTRAVENOUS

## 2019-05-11 MED ORDER — KETOROLAC TROMETHAMINE 30 MG/ML IJ SOLN
30.0000 mg | Freq: Four times a day (QID) | INTRAMUSCULAR | Status: AC
  Administered 2019-05-12 (×2): 30 mg via INTRAVENOUS
  Filled 2019-05-11 (×2): qty 1

## 2019-05-11 MED ORDER — DIBUCAINE (PERIANAL) 1 % EX OINT: 1.0000 "application " | TOPICAL_OINTMENT | CUTANEOUS | Status: DC | PRN

## 2019-05-11 MED ORDER — MORPHINE SULFATE (PF) 0.5 MG/ML IJ SOLN
INTRAMUSCULAR | Status: AC
  Filled 2019-05-11: qty 10

## 2019-05-11 MED ORDER — LACTATED RINGERS IV SOLN
500.0000 mL | INTRAVENOUS | Status: DC | PRN
  Administered 2019-05-11: 05:00:00 500 mL via INTRAVENOUS

## 2019-05-11 MED ORDER — OXYCODONE-ACETAMINOPHEN 5-325 MG PO TABS: 1.0000 | ORAL_TABLET | ORAL | Status: DC | PRN

## 2019-05-11 MED ORDER — SCOPOLAMINE 1 MG/3DAYS TD PT72
MEDICATED_PATCH | TRANSDERMAL | Status: DC | PRN
  Administered 2019-05-11: 1 via TRANSDERMAL

## 2019-05-11 MED ORDER — TERBUTALINE SULFATE 1 MG/ML IJ SOLN
0.2500 mg | Freq: Once | INTRAMUSCULAR | Status: AC | PRN
  Administered 2019-05-11: 0.25 mg via SUBCUTANEOUS
  Filled 2019-05-11: qty 1

## 2019-05-11 MED ORDER — DIPHENHYDRAMINE HCL 25 MG PO CAPS: 25.0000 mg | ORAL_CAPSULE | Freq: Four times a day (QID) | ORAL | Status: DC | PRN

## 2019-05-11 MED ORDER — OXYTOCIN 40 UNITS IN NORMAL SALINE INFUSION - SIMPLE MED: 2.5000 [IU]/h | INTRAVENOUS | Status: AC

## 2019-05-11 MED ORDER — FENTANYL CITRATE (PF) 100 MCG/2ML IJ SOLN
INTRAMUSCULAR | Status: DC | PRN
  Administered 2019-05-11: 15 ug via INTRATHECAL

## 2019-05-11 MED ORDER — COCONUT OIL OIL
1.0000 "application " | TOPICAL_OIL | Status: DC | PRN
  Administered 2019-05-11: 1 via TOPICAL

## 2019-05-11 MED ORDER — CEFAZOLIN SODIUM-DEXTROSE 2-3 GM-%(50ML) IV SOLR
INTRAVENOUS | Status: DC | PRN
  Administered 2019-05-11: 2 g via INTRAVENOUS

## 2019-05-11 MED ORDER — OXYTOCIN 40 UNITS IN NORMAL SALINE INFUSION - SIMPLE MED
INTRAVENOUS | Status: AC
  Filled 2019-05-11: qty 1000

## 2019-05-11 MED ORDER — BUPIVACAINE HCL (PF) 0.25 % IJ SOLN
INTRAMUSCULAR | Status: DC | PRN
  Administered 2019-05-11: 3 mL

## 2019-05-11 MED ORDER — OXYCODONE HCL 5 MG PO TABS
5.0000 mg | ORAL_TABLET | ORAL | Status: DC | PRN
  Administered 2019-05-12 – 2019-05-13 (×3): 5 mg via ORAL
  Filled 2019-05-11 (×3): qty 1

## 2019-05-11 MED ORDER — PHENYLEPHRINE HCL-NACL 20-0.9 MG/250ML-% IV SOLN
INTRAVENOUS | Status: AC
  Filled 2019-05-11: qty 250

## 2019-05-11 MED ORDER — SIMETHICONE 80 MG PO CHEW: 80.0000 mg | CHEWABLE_TABLET | ORAL | Status: DC | PRN

## 2019-05-11 MED ORDER — LACTATED RINGERS IV SOLN: INTRAVENOUS | Status: DC

## 2019-05-11 MED ORDER — HYDROMORPHONE HCL 1 MG/ML IJ SOLN
0.2500 mg | INTRAMUSCULAR | Status: DC | PRN
  Administered 2019-05-11: 21:00:00 0.5 mg via INTRAVENOUS

## 2019-05-11 MED ORDER — BUPIVACAINE HCL 0.5 % IJ SOLN
INTRAMUSCULAR | Status: DC | PRN
  Administered 2019-05-11: 30 mL

## 2019-05-11 MED ORDER — FLEET ENEMA 7-19 GM/118ML RE ENEM: 1.0000 | ENEMA | RECTAL | Status: DC | PRN

## 2019-05-11 MED ORDER — PENICILLIN G POT IN DEXTROSE 60000 UNIT/ML IV SOLN: 3.0000 10*6.[IU] | INTRAVENOUS | Status: DC

## 2019-05-11 MED ORDER — FENTANYL CITRATE (PF) 100 MCG/2ML IJ SOLN
INTRAMUSCULAR | Status: AC
  Filled 2019-05-11: qty 2

## 2019-05-11 MED ORDER — HYDROMORPHONE HCL 1 MG/ML IJ SOLN
INTRAMUSCULAR | Status: AC
  Filled 2019-05-11: qty 0.5

## 2019-05-11 MED ORDER — KETOROLAC TROMETHAMINE 30 MG/ML IJ SOLN
INTRAMUSCULAR | Status: AC
  Filled 2019-05-11: qty 1

## 2019-05-11 MED ORDER — BUPIVACAINE HCL (PF) 0.25 % IJ SOLN
INTRAMUSCULAR | Status: AC
  Filled 2019-05-11: qty 10

## 2019-05-11 MED ORDER — OXYTOCIN 40 UNITS IN NORMAL SALINE INFUSION - SIMPLE MED: 2.5000 [IU]/h | INTRAVENOUS | Status: DC

## 2019-05-11 MED ORDER — OXYTOCIN 40 UNITS IN NORMAL SALINE INFUSION - SIMPLE MED
INTRAVENOUS | Status: DC | PRN
  Administered 2019-05-11: 300 mL via INTRAVENOUS

## 2019-05-11 MED ORDER — CEFAZOLIN SODIUM-DEXTROSE 2-4 GM/100ML-% IV SOLN
INTRAVENOUS | Status: AC
  Filled 2019-05-11: qty 100

## 2019-05-11 MED ORDER — SIMETHICONE 80 MG PO CHEW
80.0000 mg | CHEWABLE_TABLET | ORAL | Status: DC
  Administered 2019-05-11 – 2019-05-12 (×2): 80 mg via ORAL
  Filled 2019-05-11 (×2): qty 1

## 2019-05-11 MED ORDER — BUPIVACAINE IN DEXTROSE 0.75-8.25 % IT SOLN
INTRATHECAL | Status: DC | PRN
  Administered 2019-05-11: 1.8 mL via INTRATHECAL

## 2019-05-11 MED ORDER — ZOLPIDEM TARTRATE 5 MG PO TABS: 5.0000 mg | ORAL_TABLET | Freq: Every evening | ORAL | Status: DC | PRN

## 2019-05-11 MED ORDER — OXYTOCIN BOLUS FROM INFUSION: 500.0000 mL | Freq: Once | INTRAVENOUS | Status: DC

## 2019-05-11 MED ORDER — LIDOCAINE HCL (PF) 1 % IJ SOLN
INTRAMUSCULAR | Status: AC
  Filled 2019-05-11: qty 10

## 2019-05-11 MED ORDER — OXYCODONE HCL 5 MG PO TABS: 5.0000 mg | ORAL_TABLET | Freq: Once | ORAL | Status: DC | PRN

## 2019-05-11 MED ORDER — OXYTOCIN 40 UNITS IN NORMAL SALINE INFUSION - SIMPLE MED
1.0000 m[IU]/min | INTRAVENOUS | Status: DC
  Administered 2019-05-11: 01:00:00 1 m[IU]/min via INTRAVENOUS
  Filled 2019-05-11: qty 1000

## 2019-05-11 MED ORDER — DEXAMETHASONE SODIUM PHOSPHATE 10 MG/ML IJ SOLN
INTRAMUSCULAR | Status: DC | PRN
  Administered 2019-05-11: 10 mg via INTRAVENOUS

## 2019-05-11 MED ORDER — MEPERIDINE HCL 25 MG/ML IJ SOLN: 6.2500 mg | INTRAMUSCULAR | Status: DC | PRN

## 2019-05-11 MED ORDER — ACETAMINOPHEN 500 MG PO TABS
1000.0000 mg | ORAL_TABLET | Freq: Four times a day (QID) | ORAL | Status: DC
  Administered 2019-05-11 – 2019-05-13 (×6): 1000 mg via ORAL
  Filled 2019-05-11 (×6): qty 2

## 2019-05-11 MED ORDER — TETANUS-DIPHTH-ACELL PERTUSSIS 5-2.5-18.5 LF-MCG/0.5 IM SUSP: 0.5000 mL | Freq: Once | INTRAMUSCULAR | Status: DC

## 2019-05-11 MED ORDER — WITCH HAZEL-GLYCERIN EX PADS: 1.0000 "application " | MEDICATED_PAD | CUTANEOUS | Status: DC | PRN

## 2019-05-11 MED ORDER — KETOROLAC TROMETHAMINE 30 MG/ML IJ SOLN
30.0000 mg | Freq: Once | INTRAMUSCULAR | Status: AC | PRN
  Administered 2019-05-11: 21:00:00 30 mg via INTRAVENOUS

## 2019-05-11 MED ORDER — PROMETHAZINE HCL 25 MG/ML IJ SOLN: 6.2500 mg | INTRAMUSCULAR | Status: DC | PRN

## 2019-05-11 MED ORDER — SCOPOLAMINE 1 MG/3DAYS TD PT72
MEDICATED_PATCH | TRANSDERMAL | Status: AC
  Filled 2019-05-11: qty 1

## 2019-05-11 MED ORDER — BUPIVACAINE HCL (PF) 0.5 % IJ SOLN
INTRAMUSCULAR | Status: AC
  Filled 2019-05-11: qty 90

## 2019-05-11 MED ORDER — ACETAMINOPHEN 325 MG PO TABS: 650.0000 mg | ORAL_TABLET | ORAL | Status: DC | PRN

## 2019-05-11 MED ORDER — LIDOCAINE HCL (PF) 1 % IJ SOLN: 30.0000 mL | INTRAMUSCULAR | Status: DC | PRN

## 2019-05-11 MED ORDER — OXYCODONE HCL 5 MG/5ML PO SOLN: 5.0000 mg | Freq: Once | ORAL | Status: DC | PRN

## 2019-05-11 MED ORDER — MENTHOL 3 MG MT LOZG: 1.0000 | LOZENGE | OROMUCOSAL | Status: DC | PRN

## 2019-05-11 MED ORDER — IBUPROFEN 800 MG PO TABS
800.0000 mg | ORAL_TABLET | Freq: Four times a day (QID) | ORAL | Status: DC
  Administered 2019-05-12 – 2019-05-13 (×4): 800 mg via ORAL
  Filled 2019-05-11 (×4): qty 1

## 2019-05-11 MED ORDER — SENNOSIDES-DOCUSATE SODIUM 8.6-50 MG PO TABS
2.0000 | ORAL_TABLET | ORAL | Status: DC
  Administered 2019-05-11 – 2019-05-12 (×2): 2 via ORAL
  Filled 2019-05-11 (×2): qty 2

## 2019-05-11 MED ORDER — BUPIVACAINE HCL (PF) 0.5 % IJ SOLN
INTRAMUSCULAR | Status: AC
  Filled 2019-05-11: qty 30

## 2019-05-11 MED ORDER — SIMETHICONE 80 MG PO CHEW
80.0000 mg | CHEWABLE_TABLET | Freq: Three times a day (TID) | ORAL | Status: DC
  Administered 2019-05-12 – 2019-05-13 (×5): 80 mg via ORAL
  Filled 2019-05-11 (×5): qty 1

## 2019-05-11 MED ORDER — ZOLPIDEM TARTRATE 5 MG PO TABS
5.0000 mg | ORAL_TABLET | Freq: Every evening | ORAL | Status: DC | PRN
  Administered 2019-05-11: 5 mg via ORAL
  Filled 2019-05-11: qty 1

## 2019-05-11 MED ORDER — FENTANYL CITRATE (PF) 100 MCG/2ML IJ SOLN
50.0000 ug | INTRAMUSCULAR | Status: DC | PRN
  Administered 2019-05-11 (×2): 100 ug via INTRAVENOUS
  Filled 2019-05-11 (×2): qty 2

## 2019-05-11 MED ORDER — ONDANSETRON HCL 4 MG/2ML IJ SOLN: 4.0000 mg | Freq: Four times a day (QID) | INTRAMUSCULAR | Status: DC | PRN

## 2019-05-11 MED ORDER — SOD CITRATE-CITRIC ACID 500-334 MG/5ML PO SOLN
30.0000 mL | ORAL | Status: DC | PRN
  Administered 2019-05-11: 30 mL via ORAL
  Filled 2019-05-11: qty 30

## 2019-05-11 MED ORDER — PRENATAL MULTIVITAMIN CH
1.0000 | ORAL_TABLET | Freq: Every day | ORAL | Status: DC
  Administered 2019-05-12 – 2019-05-13 (×2): 1 via ORAL
  Filled 2019-05-11 (×2): qty 1

## 2019-05-11 MED ORDER — SODIUM CHLORIDE 0.9 % IV SOLN: 5.0000 10*6.[IU] | Freq: Once | INTRAVENOUS | Status: DC

## 2019-05-11 MED ORDER — MORPHINE SULFATE (PF) 0.5 MG/ML IJ SOLN
INTRAMUSCULAR | Status: DC | PRN
  Administered 2019-05-11: .15 mg via INTRATHECAL

## 2019-05-11 SURGICAL SUPPLY — 41 items
BARRIER ADHS 3X4 INTERCEED (GAUZE/BANDAGES/DRESSINGS) ×3 IMPLANT
BENZOIN TINCTURE PRP APPL 2/3 (GAUZE/BANDAGES/DRESSINGS) ×2 IMPLANT
CHLORAPREP W/TINT 26ML (MISCELLANEOUS) ×3 IMPLANT
CLAMP CORD UMBIL (MISCELLANEOUS) IMPLANT
CLOSURE WOUND 1/2 X4 (GAUZE/BANDAGES/DRESSINGS) ×1
CLOTH BEACON ORANGE TIMEOUT ST (SAFETY) ×3 IMPLANT
DECANTER SPIKE VIAL GLASS SM (MISCELLANEOUS) ×2 IMPLANT
DERMABOND ADVANCED (GAUZE/BANDAGES/DRESSINGS) ×4
DERMABOND ADVANCED .7 DNX12 (GAUZE/BANDAGES/DRESSINGS) IMPLANT
DRSG OPSITE POSTOP 3X4 (GAUZE/BANDAGES/DRESSINGS) ×2 IMPLANT
DRSG OPSITE POSTOP 4X10 (GAUZE/BANDAGES/DRESSINGS) ×3 IMPLANT
ELECT REM PT RETURN 9FT ADLT (ELECTROSURGICAL) ×3
ELECTRODE REM PT RTRN 9FT ADLT (ELECTROSURGICAL) ×1 IMPLANT
EXTRACTOR VACUUM BELL STYLE (SUCTIONS) IMPLANT
GLOVE BIO SURGEON STRL SZ7 (GLOVE) ×3 IMPLANT
GLOVE BIOGEL PI IND STRL 7.0 (GLOVE) ×2 IMPLANT
GLOVE BIOGEL PI INDICATOR 7.0 (GLOVE) ×4
GOWN STRL REUS W/TWL LRG LVL3 (GOWN DISPOSABLE) ×6 IMPLANT
KIT ABG SYR 3ML LUER SLIP (SYRINGE) IMPLANT
NDL HYPO 25X5/8 SAFETYGLIDE (NEEDLE) IMPLANT
NEEDLE HYPO 22GX1.5 SAFETY (NEEDLE) ×2 IMPLANT
NEEDLE HYPO 25X5/8 SAFETYGLIDE (NEEDLE) IMPLANT
NS IRRIG 1000ML POUR BTL (IV SOLUTION) ×3 IMPLANT
PACK C SECTION WH (CUSTOM PROCEDURE TRAY) ×3 IMPLANT
PAD OB MATERNITY 4.3X12.25 (PERSONAL CARE ITEMS) ×3 IMPLANT
PENCIL SMOKE EVAC W/HOLSTER (ELECTROSURGICAL) ×3 IMPLANT
RTRCTR C-SECT PINK 25CM LRG (MISCELLANEOUS) ×3 IMPLANT
STRIP CLOSURE SKIN 1/2X4 (GAUZE/BANDAGES/DRESSINGS) ×1 IMPLANT
SUT CHROMIC 0 CTX 36 (SUTURE) IMPLANT
SUT MON AB 4-0 PS1 27 (SUTURE) ×3 IMPLANT
SUT PDS AB 0 CTX 60 (SUTURE) ×2 IMPLANT
SUT PLAIN 0 NONE (SUTURE) IMPLANT
SUT PLAIN 2 0 XLH (SUTURE) ×3 IMPLANT
SUT VIC AB 0 CTX 36 (SUTURE) ×10
SUT VIC AB 0 CTX36XBRD ANBCTRL (SUTURE) ×5 IMPLANT
SUT VIC AB 2-0 CT1 27 (SUTURE) ×2
SUT VIC AB 2-0 CT1 TAPERPNT 27 (SUTURE) ×1 IMPLANT
SYR CONTROL 10ML LL (SYRINGE) ×4 IMPLANT
TOWEL OR 17X24 6PK STRL BLUE (TOWEL DISPOSABLE) ×3 IMPLANT
TRAY FOLEY W/BAG SLVR 14FR LF (SET/KITS/TRAYS/PACK) IMPLANT
WATER STERILE IRR 1000ML POUR (IV SOLUTION) ×3 IMPLANT

## 2019-05-11 NOTE — Lactation Note (Signed)
This note was copied from a baby's chart. Lactation Consultation Note  Patient Name: Boy Yamilex Borgwardt [facility] Today's Date: 05/11/2019  P2,  3 hour term female infant. As LC was entering room, RN informed LC mom doesn't want to see LC services until in the morning. Per RN,  Mom is experienced at breastfeeding, mom now has a "do not disturb",  note on her door.     Maternal Data Does the patient have breastfeeding experience prior to this delivery?: Yes  Feeding Feeding Type: Breast Milk  LATCH Score Latch: Grasps breast easily, tongue down, lips flanged, rhythmical sucking.  Audible Swallowing: None  Type of Nipple: Everted at rest and after stimulation  Comfort (Breast/Nipple): Soft / non-tender  Hold (Positioning): Full assist, staff holds infant at breast  LATCH Score: 6  Interventions Interventions: Assisted with latch;Skin to skin;Breast massage;Adjust position;Support pillows  Lactation Tools Discussed/Used     Consult Status      Danelle Earthly 05/11/2019, 10:09 PM

## 2019-05-11 NOTE — Anesthesia Preprocedure Evaluation (Signed)
Anesthesia Evaluation  Patient identified by MRN, date of birth, ID band Patient awake    Reviewed: Allergy & Precautions, NPO status , Patient's Chart, lab work & pertinent test results  Airway Mallampati: II  TM Distance: >3 FB Neck ROM: Full    Dental no notable dental hx. (+) Teeth Intact   Pulmonary former smoker,    Pulmonary exam normal breath sounds clear to auscultation       Cardiovascular negative cardio ROS Normal cardiovascular exam Rhythm:Regular Rate:Normal     Neuro/Psych negative neurological ROS  negative psych ROS   GI/Hepatic negative GI ROS, Neg liver ROS,   Endo/Other  negative endocrine ROS  Renal/GU negative Renal ROS  negative genitourinary   Musculoskeletal negative musculoskeletal ROS (+)   Abdominal   Peds negative pediatric ROS (+)  Hematology  (+) Blood dyscrasia, anemia , hct 34.9, plt 193   Anesthesia Other Findings   Reproductive/Obstetrics (+) Pregnancy Fetal intolerance to labor TOLAC 1st section was emergent/traumatic                             Anesthesia Physical Anesthesia Plan  ASA: II and emergent  Anesthesia Plan: Spinal   Post-op Pain Management:    Induction:   PONV Risk Score and Plan: 2 and Propofol infusion and TIVA  Airway Management Planned: Natural Airway and Nasal Cannula  Additional Equipment: None  Intra-op Plan:   Post-operative Plan:   Informed Consent: I have reviewed the patients History and Physical, chart, labs and discussed the procedure including the risks, benefits and alternatives for the proposed anesthesia with the patient or authorized representative who has indicated his/her understanding and acceptance.       Plan Discussed with: CRNA  Anesthesia Plan Comments:         Anesthesia Quick Evaluation

## 2019-05-11 NOTE — Progress Notes (Signed)
S:  Discussed with patient risk and benefits of a TOLAC.  Discussed risk of rupture less than 1 percent, chance of injury or death of infant or mother if rupture,  Increased risk with emergency low transverse cesarean versus repeat.  Pt verbalized understanding and all questions were answered. Pt would like a TOL.  Discussed induction process of using low dose pitocin to ripen cervix and possible use of foley bulb.  Consent was signed by patient.

## 2019-05-11 NOTE — Progress Notes (Signed)
[name] is a 34 y.o. G2P1 at [redacted]w[redacted]d by   Subjective: Pt feels strong cramps, does not want pain medication yet.  She feels good, got some rest last night.  Objective: BP 103/71 (BP Location: Right Arm)   Pulse 84   Temp 98.4 F (36.9 C) (Oral)   Resp 16   Ht 5\' 11"  (1.803 m)   Wt 81.1 kg   LMP 07/29/2018   BMI 24.95 kg/m  No intake/output data recorded. Total I/O In: 885.7 [I.V.:885.7] Out: -   FHT:  FHR: 140s bpm, variability: moderate,  accelerations:  Present,  decelerations:  Present Prolonged x 1-remote UC:   regular, every ~3  minutes SVE:   Dilation: Fingertip Effacement (%): Thick Station: -3 Exam by:: Marrion Accomando  Labs: Lab Results  Component Value Date   WBC 10.8 (H) 05/11/2019   HGB 11.5 (L) 05/11/2019   HCT 34.9 (L) 05/11/2019   MCV 95.1 05/11/2019   PLT 193 05/11/2019    Assessment / Plan: Induction of labor due to postterm,  progressing well on pitocin  Labor: Continue cervical ripening with low dose Pitocin.  Pt is in a regular pattern, feels some discomfort and contractions palpate firm.  Attempt foley bulb with next exam. Preeclampsia:  BP normal Fetal Wellbeing:  Category I Pain Control:  Declines meds at this time. I/D:  GBS+ by urine Anticipated MOD:  VBAC  05/13/2019 05/11/2019, 8:20 AM

## 2019-05-11 NOTE — Anesthesia Postprocedure Evaluation (Signed)
Anesthesia Post Note  Patient: [name]  Procedure(s) Performed: CESAREAN SECTION (N/A Abdomen)     Patient location during evaluation: PACU Anesthesia Type: Spinal Level of consciousness: oriented and awake and alert Pain management: pain level controlled Vital Signs Assessment: post-procedure vital signs reviewed and stable Respiratory status: spontaneous breathing, respiratory function stable and nonlabored ventilation Cardiovascular status: blood pressure returned to baseline and stable Postop Assessment: no headache, no backache, no apparent nausea or vomiting and spinal receding Anesthetic complications: no    Last Vitals:  Vitals:   05/11/19 2100 05/11/19 2126  BP: 98/67 99/62  Pulse:  88  Resp: 18   Temp:  36.8 C  SpO2:      Last Pain:  Vitals:   05/11/19 2113  TempSrc:   PainSc: 2    Pain Goal:    LLE Motor Response: Purposeful movement (05/11/19 2030) LLE Sensation: Increased (05/11/19 2030) RLE Motor Response: Purposeful movement (05/11/19 2030) RLE Sensation: Full sensation (05/11/19 2030) L Sensory Level: L4-Anterior knee, lower leg (05/11/19 2030) R Sensory Level: L4-Anterior knee, lower leg (05/11/19 2030)    Lucretia Kern

## 2019-05-11 NOTE — Progress Notes (Signed)
[name] is a 34 y.o. G2P1 at [redacted]w[redacted]d   Notified by RN that baby had another deceleration x 5 minutes after contractions q 1 minute.  Subjective: Pt reports intense contractions.  Discussed fetal decelerations and discussed cesarean section due to fetal intolerance remote from delivery.  Pt requested to speak with her husband privately.  Objective: BP (!) 102/56   Pulse 81   Temp 97.9 F (36.6 C) (Oral)   Resp 18   Ht 5\' 11"  (1.803 m)   Wt 81.1 kg   LMP 07/29/2018   BMI 24.95 kg/m  No intake/output data recorded. Total I/O In: 2117.7 [I.V.:2117.7] Out: -   Abd:  No pain over incision FHT:  FHR: 140s bpm, variability: moderate,  accelerations:  Present,  decelerations:  Present prolonged deceleration x 5 minutes, return to baseline, still with varibility but decreased from previous UC:   regular, every 1-3 minutes SVE:   Dilation: 1.5 Effacement (%): Thick Station: -3 Exam by:: dr [email or web address]  Labs: Lab Results  Component Value Date   WBC 10.8 (H) 05/11/2019   HGB 11.5 (L) 05/11/2019   HCT 34.9 (L) 05/11/2019   MCV 95.1 05/11/2019   PLT 193 05/11/2019    Assessment / Plan: IUP @ 40 6/7 weeks  Labor: early labor starting with foley bulb in place Preeclampsia:  BP normal Fetal Wellbeing:  Category III and Recovered.  Given Terbutaline x 1.  Foley bulb removed. Pain Control:  pending I/D:  GBS+ by urine. Anticipated MOD:  Offered c-section.   After discussion with her husband, they desire to proceed with repeat c-section.  Discussed R/B/A.  All questions answered.  Dr. 05/13/2019  And Mora Appl in room during consenting less than 5 minutes after starting.  Introduced to patient. D/w pt that I would assist Dr. Dorisann Frames until delivery.  Pt ok with that plan.  Mora Appl 05/11/2019, 5:35 PM

## 2019-05-11 NOTE — Op Note (Signed)
Cesarean Section Procedure Note  Indications: previous uterine incision kerr x 1  Pre-operative Diagnosis: 40 week 6 day pregnancy, prior Low transverse cesarean             Section with failed TOLAC due to fetal intolerance of labor               Remote from delivery  Post-operative Diagnosis: same  Surgeon: Caffie Damme MD  1st Assistants: Thurnell Lose, MD  2ND Assistant:  Gavin Potters, CNM  Anesthesia: Spinal anesthesia  ASA Class: 2  Procedure Details   The patient was counseled about the risks, benefits, complications of the cesarean section. The patient concurred with the proposed plan, giving informed consent.  The site of surgery properly noted/marked. The patient was taken to Operating Room # C, identified as [name] and the procedure verified as C-Section Delivery. A Time Out was held and the above information confirmed.  After spinal was found to adequate , the patient was placed in the dorsal supine position with a leftward tilt, draped and prepped in the usual sterile manner. A Pfannenstiel incision was made and carried down through the subcutaneous tissue to the fascia.  The fascia was incised in the midline and the fascial incision was extended laterally with Mayo scissors. The superior aspect of the fascial incision was grasped with Coker clamps x2, tented up and the rectus muscles dissected off sharply with the scalpel. The rectus was then dissected off with blunt dissection and Mayo scissors inferiorly. The rectus muscles were separated in the midline. The abdominal peritoneum was identified, tented up between two hemostats, entered sharply using Metzenbaum scissors, and the incision was extended superiorly and inferiorly with good visualization of the bladder. The Alexis retractor was then deployed. The vesicouterine peritoneum was identified, tented up, entered sharply with Metzenbaum scissors, and the bladder flap was created digitally. Scalpel was  then used to make a low transverse incision on the uterus which was extended laterally with  blunt dissection. The fetal vertex was identified, the head was brought out from the pelvis. The head was then delivered easily through the uterine incision followed by the body. The A live female infant was bulb suctioned on the operative field cried vigorously, cord was clamped and cut and the infant was passed to the waiting neonatologist. Apgars 8/9. Placenta was then delivered spontaneously, intact and appear normal, the uterus was cleared of all clot and debris. The uterine incision was repaired with #0 Monocryl in running locked fashion. A second imbricating suture was performed using the same suture. The incision was hemostatic. Ovaries and tubes were inspected and normal. The Alexis retractor was removed. The abdominal cavity was cleared of all clot and debris. The abdominal peritoneum was reapproximated with 2-0 chromic in a running fashion, the rectus muscles was reapproximated also with #2 chromic in running fashion. The fascia was closed with looped 0 PDS in a running fashion. The subcuticular layer was irrigated and all bleeders cauterized.    The incision was injected with 30 mL of 0.5% Marcaine. Interrupted sutures of 2-0 plain were used to re-approximate Scarpas fascia.  The skin was closed with 4-0 vicryl in a subcuticular fashion using a Lanny Hurst needle. The incision was dressed with benzoine, steri strips and pressure dressing. All sponge lap and needle counts were correct x3. Patient tolerated the procedure well and recovered in stable condition following the procedure.  Instrument, sponge, and needle counts were correct prior the abdominal closure and at the  conclusion of the case.   Findings: Live female infant, Apgars 8/9, clear amniotic fluid, normal appearing placenta, normal uterus, bilateral tubes and ovaries  Estimated Blood Loss: 487 mL  IVF: 1400 mL         Drains: Foley catheter  Urine  output: 100 mL clear urine         Specimens: Placenta to L&D         Implants: none         Complications:  None; patient tolerated the procedure well.         Disposition: PACU - hemodynamically stable.   [name]

## 2019-05-11 NOTE — Transfer of Care (Signed)
Immediate Anesthesia Transfer of Care Note  Patient: [name]  Procedure(s) Performed: CESAREAN SECTION (N/A Abdomen)  Patient Location: PACU  Anesthesia Type:Spinal  Level of Consciousness: awake, alert  and oriented  Airway & Oxygen Therapy: Patient Spontanous Breathing  Post-op Assessment: Report given to RN and Post -op Vital signs reviewed and stable  Post vital signs: Reviewed and stable  Last Vitals:  Vitals Value Taken Time  BP 106/59 05/11/19 1946  Temp    Pulse 92 05/11/19 1951  Resp 17 05/11/19 1951  SpO2 100 % 05/11/19 1951  Vitals shown include unvalidated device data.  Last Pain:  Vitals:   05/11/19 1645  TempSrc: Oral  PainSc:          Complications: No apparent anesthesia complications

## 2019-05-11 NOTE — Progress Notes (Signed)
Bernerd Pho, CNM discussed risk and benefits of TOLAC with patient. Patient agreed and would like to proceed with induction. Consent signed by patient.   Lenox Ponds, RN

## 2019-05-11 NOTE — Progress Notes (Addendum)
Informed by RN that pt had fetal decelerations on Pitocin.  Contractions were q1 minute.  Pitocin discontinued.  Fetal tracing improved on her left side.  Upon arrival, pt on her left side.  Other than appearing anxious, she was not in any distress.  Pt reports she has abdominal pain but only with contractions.  They are decreasing in intensity.  Pt does not want a c-section at this time.  Abd:  Gravid, nontender over incision. EFM:   130s, reactive, three v-shaped decelerations after the contractions, one with slow return to baseline.  Good variability throughout.  ~ 15 minutes after last deceleration, 4 accelerations in 20 minutes noted. Contraction q2 minutes to now 3-4.  Pt breathing through contractions..  Cervix deferred at this time.  Discussed with pt and partner significance of decelerations.  I do not think that pt is experiencing uterine rupture.  I suspect hyperstimulation as the etiology.  Fetal status now is very reassuring.  I would like the pt to rest for now.  I will attempt a foley bulb.  If unsuccessful, we can resume Pitocin slowly or just wait and see if pt continues to contract on her own.  Informed her that she has Atarax as needed for anxiety.  Pt has a h/o an emergent c-section that was traumatic.  We discussed thoughts/feelings regarding this history. Pt was reminded that she can change her mind at any time. All questions answered.  Return to attempt Foley bulb.  Addendum: Fentanyl IV administered prior to foley bulb placement. Graves speculum used to visualize cervix.  Cervix was prepped with betadine.  Foley bulb was slowly advanced through cervix.  Due to larger labia, pt had pinching/pain with speculum in place.  RN helped with retraction.  After speculum was removed pt was without pain.

## 2019-05-11 NOTE — Anesthesia Procedure Notes (Signed)
Spinal  Patient location during procedure: OR Staffing Performed: anesthesiologist  Anesthesiologist: Itzamara Casas E, MD Preanesthetic Checklist Completed: patient identified, IV checked, risks and benefits discussed, surgical consent, monitors and equipment checked, pre-op evaluation and timeout performed Spinal Block Patient position: sitting Prep: DuraPrep and site prepped and draped Patient monitoring: continuous pulse ox, blood pressure and heart rate Approach: midline Location: L3-4 Injection technique: single-shot Needle Needle type: Pencan  Needle gauge: 24 G Needle length: 9 cm Additional Notes Functioning IV was confirmed and monitors were applied. Sterile prep and drape, including hand hygiene and sterile gloves were used. The patient was positioned and the spine was prepped. The skin was anesthetized with lidocaine.  Free flow of clear CSF was obtained prior to injecting local anesthetic into the CSF. The needle was carefully withdrawn. The patient tolerated the procedure well.      

## 2019-05-11 NOTE — H&P (Signed)
[name] Alphonsus Sias is a 34 y.o. female G2 P1001 @ 40 6/7 weeks will be induced at 41 weeks for postdates. Pt has a h/o previous cesarean section (documented LTCS w/ 2 layer closure) in 2018 due to a suspected abruption. PNC with [facility] Dion Body) has been complicated by: GBS+ by urine.   Left breast mass that was benign.  OB History    Gravida  2   Para  1   Term      Preterm      AB      Living        SAB      TAB      Ectopic      Multiple      Live Births             Past Medical History:  Diagnosis Date  . Medical history non-contributory    Past Surgical History:  Procedure Laterality Date  . CESAREAN SECTION    . WISDOM TOOTH EXTRACTION     Family History: family history includes Alcohol abuse in her paternal grandfather; Cancer in her paternal grandmother; Healthy in her father, mother, sister, and sister; Heart attack in her maternal grandfather; Hyperlipidemia in her maternal grandfather; Hypertension in her maternal grandfather; Pulmonary fibrosis in her maternal grandfather; Skin cancer in her maternal aunt and maternal grandfather. Social History:  reports that she quit smoking about 11 years ago. Her smoking use included cigarettes. She has a 0.25 pack-year smoking history. She has never used smokeless tobacco. She reports current alcohol use of about 1.0 standard drinks of alcohol per week. She reports that she does not use drugs.     Maternal Diabetes: No Genetic Screening: Declined Maternal Ultrasounds/Referrals: Normal Fetal Ultrasounds or other Referrals:  None Maternal Substance Abuse:  No Significant Maternal Medications:  None Significant Maternal Lab Results:  Group B Strep positive Other Comments:  None  Review of Systems  Constitutional: Negative for chills and fatigue.  Respiratory: Negative for cough and shortness of breath.   Gastrointestinal: Negative for abdominal pain.   Maternal Medical History:  Fetal activity:  Perceived fetal activity is normal.    Prenatal complications: no prenatal complications     Last menstrual period 07/29/2018. Maternal Exam:  Abdomen: Estimated fetal weight is 7 lbs 4 oz +/- 7 oz (05/02/19).   Fetal presentation: vertex  Introitus: Normal vulva. Pelvis: adequate for delivery.   Cervix: Cervix evaluated by digital exam.   Closed, posterior, soft  Fetal Exam Fetal Monitor Review: Mode: hand-held doppler probe.   Baseline rate: 135.      Physical Exam  Constitutional: She is oriented to person, place, and time. She appears well-developed and well-nourished.  HENT:  Head: Normocephalic and atraumatic.  Eyes: EOM are normal.  Respiratory: Effort normal. No respiratory distress.  GI: Soft.  Genitourinary:    Vulva normal.   Musculoskeletal:        General: No tenderness or edema. Normal range of motion.     Cervical back: Normal range of motion.  Neurological: She is alert and oriented to person, place, and time.  Skin: Skin is warm and dry.  Psychiatric: She has a normal mood and affect.    Prenatal labs: ABO, Rh: O/Positive/-- (09/18 0000) Antibody: Negative (09/18 0000) Rubella: Immune (09/18 0000) RPR: Nonreactive (09/18 0000)  HBsAg: Negative (09/18 0000)  HIV: Non-reactive (09/18 0000)  GBS: Positive/-- (09/18 0000)   Assessment/Plan: IUP @ 39 6/7 weeks Postdates IOL @ 41 0/7  weeks H/o previous LTCS-Desires TOLAC GBS+ by urine  IOL with low dose Pitocin due to closed cervix.  Foley bulb when 1 cm dilated.  Pt has been counseled on R/B/A of TOLAC. PCN for GBS prophylaxis with SROM or active labor.   Thurnell Lose

## 2019-05-12 ENCOUNTER — Encounter: Payer: Self-pay | Admitting: *Deleted

## 2019-05-12 LAB — CBC
HCT: 33.7 % — ABNORMAL LOW (ref 36.0–46.0)
Hemoglobin: 10.9 g/dL — ABNORMAL LOW (ref 12.0–15.0)
MCH: 31.9 pg (ref 26.0–34.0)
MCHC: 32.3 g/dL (ref 30.0–36.0)
MCV: 98.5 fL (ref 80.0–100.0)
Platelets: 155 10*3/uL (ref 150–400)
RBC: 3.42 MIL/uL — ABNORMAL LOW (ref 3.87–5.11)
RDW: 13.5 % (ref 11.5–15.5)
WBC: 19.3 10*3/uL — ABNORMAL HIGH (ref 4.0–10.5)
nRBC: 0 % (ref 0.0–0.2)

## 2019-05-12 NOTE — Lactation Note (Addendum)
This note was copied from a baby's chart. Lactation Consultation Note  Patient Name: [name] [facility] Today's Date: 05/12/2019 Reason for consult: Initial assessment;Term P2, 25 hour term female infant. Per mom, she has DEBP and hand pump at home. Mom hx: C/S delivery. Infant had one void and three stools diapers since birth. Tools : mom was given hand pump to pre-pump flat nipples prior to latching infant at breast. Mom 's breast responds to stimulation well a NS is not needed at this time. Per mom, she breastfed her 46 year old son for 2 years, in the beginning it was difficult she had use a NS due to having flat nipples. This infant is latching well on her right breast without difficulties and breastfeds for 15 minutes but she is struggling to get him to latch on her left breast. Mom attempted to latch infant on left breast using the football hold, infant would not sustain latch. LC asked mom pre-pump her breast prior to latching infant and mom changed position to using cross cradle hold, LC ask mom to shape breast wedge hold forming a ceil and infant sustained latch without difficulty. LC had mom break latch, mom re-latch infant without assistance from [facility] and infant sustained latch, rthymitic  suckling pattern with swallows, per mom, she only feel a tug and infant was still breastfeeding after 10 minutes. Infant is currently cluster feeding, LC mention after latching infant mom could hand express or use hand pump and give infant back any extra volume using a foley cup and then dad will do some STS enabling mom to rest. Mom is currently feeding infant on demand according to cues and know not to go past 3 hours without feeding infant. Mom knows to call RN or LC if she needs assistance with latching infant at breast. Reviewed Baby & Me book's Breastfeeding Basics.  Mom made aware of O/P services, breastfeeding support groups, community resources, and our phone # for post-discharge questions.   Maternal Data Formula Feeding for Exclusion: No Has patient been taught Hand Expression?: Yes Does the patient have breastfeeding experience prior to this delivery?: Yes  Feeding Feeding Type: Breast Fed  LATCH Score Latch: Repeated attempts needed to sustain latch, nipple held in mouth throughout feeding, stimulation needed to elicit sucking reflex.  Audible Swallowing: Spontaneous and intermittent  Type of Nipple: Flat  Comfort (Breast/Nipple): Soft / non-tender  Hold (Positioning): Assistance needed to correctly position infant at breast and maintain latch.  LATCH Score: 7  Interventions Interventions: Breast feeding basics reviewed;Breast compression;Adjust position;Assisted with latch;Hand pump;Support pillows;Skin to skin;Breast massage;Position options;Hand express;Expressed milk;Pre-pump if needed  Lactation Tools Discussed/Used WIC Program: No Pump Review: Setup, frequency, and cleaning;Milk Storage Initiated by:: Danelle Earthly, IBCLC Date initiated:: 05/13/19   Consult Status Consult Status: Follow-up Date: 05/13/19 Follow-up type: In-patient    Danelle Earthly 05/12/2019, 8:25 PM

## 2019-05-12 NOTE — Lactation Note (Signed)
This note was copied from a baby's chart. [name] now 1 hours old.Mom reports this is not a good time.  She isn't feeling well.  That they are coming to put a catheter in her.  Mom reports he ate well about thirty minutes ago.  Asked to come back much later in afternoon.

## 2019-05-12 NOTE — Lactation Note (Signed)
This note was copied from a baby's chart. Lactation Consultation Note  Patient Name: [name] [facility] Today's Date: 05/12/2019   Attempt to see mom again.  Everyone sleeping.  Will follow up this pm.  Maternal Data    Feeding Feeding Type: Breast Fed  [facility] Score                   Interventions    Lactation Tools Discussed/Used     Consult Status      Shakira Los Michaelle Copas 05/12/2019, 5:51 PM

## 2019-05-12 NOTE — Progress Notes (Signed)
Belenda Alviar [facility] [phone] Postpartum Postoperative Day # 1  [address] [place], [place], [redacted]w[redacted]d, S/P repeat LT Cesarean Section due to Catriona Dillenbeck is a 34 y.o. female G2 P1001 @ 40 6/7 weeks will be induced at 41 weeks for postdates.Pt has a h/o previous cesarean section (documented LTCS w/ 2 layer closure) in 2018 due to a suspected abruption. PNC with [facility] Simona Huh) has been complicated by: GBS+ by urine.  Left breast mass that was benign.  Subjective: Patient up ad lib, denies syncope or dizziness. Reports consuming regular diet without issues and denies N/V. Patient reports 0 bowel movement + passing flatus.  Denies issues with urination and reports bleeding is "lighter."  Patient is breastfeeding and reports going well.  Desires mirena @ 6 weeks for postpartum contraception.  Pain is being appropriately managed with use of po meds.   Objective: Patient Vitals for the past 24 hrs:  BP Temp Temp src Pulse Resp SpO2  05/12/19 0229 102/69 98.1 F (36.7 C) Oral 61 -- --  05/11/19 2236 99/70 99.1 F (37.3 C) -- 83 -- --  05/11/19 2126 99/62 98.2 F (36.8 C) -- 88 18 --  05/11/19 2100 98/67 -- -- -- 18 --  05/11/19 2045 94/67 -- -- 95 14 100 %  05/11/19 2033 -- -- -- 87 (!) 22 100 %  05/11/19 2032 -- -- -- -- 20 --  05/11/19 2031 -- -- -- 92 19 100 %  05/11/19 2030 -- -- -- 98 (!) 23 100 %  05/11/19 2028 -- -- -- 75 17 100 %  05/11/19 2027 -- -- -- 91 (!) 22 100 %  05/11/19 2015 91/64 97.8 F (36.6 C) Oral 100 16 99 %  05/11/19 2000 100/75 -- -- 93 (!) 22 100 %  05/11/19 1947 (!) 106/59 -- -- 96 20 98 %  05/11/19 1945 -- 98.7 F (37.1 C) -- 93 -- 99 %  05/11/19 1700 -- -- -- -- 18 --  05/11/19 1645 (!) 102/56 97.9 F (36.6 C) Oral 81 -- --  05/11/19 1639 -- -- -- -- 18 --  05/11/19 1601 (!) 84/69 -- -- 82 -- --  05/11/19 1501 (!) 89/60 -- -- 65 -- --  05/11/19 1431 98/64 -- -- 72 -- --  05/11/19 1402 113/70 -- -- 79 -- --  05/11/19 1302 101/66 -- -- 75 -- --   05/11/19 1231 102/80 -- -- 86 -- --  05/11/19 1201 105/70 98.1 F (36.7 C) Oral 70 20 --  05/11/19 1136 107/62 -- -- 77 16 --  05/11/19 1001 95/63 -- -- 79 -- --  05/11/19 0901 103/67 -- -- 86 -- --  05/11/19 0831 107/68 -- -- 76 -- --  05/11/19 0800 103/71 -- -- 84 -- --  05/11/19 0731 (!) 84/45 98.4 F (36.9 C) Oral 74 16 --  05/11/19 0700 105/68 -- -- 91 16 --  05/11/19 0630 (!) 92/56 -- -- 73 -- --  05/11/19 0603 101/72 -- -- 88 16 --     Physical Exam:  General: alert, cooperative, appears stated age and no distress Mood/Affect: Happy Lungs: clear to auscultation, no wheezes, rales or rhonchi, symmetric air entry.  Heart: normal rate, regular rhythm, normal S1, S2, no murmurs, rubs, clicks or gallops. Breast: breasts appear normal, no suspicious masses, no skin or nipple changes or axillary nodes. Abdomen:  + bowel sounds, soft, non-tender Incision: healing well, no significant drainage, no dehiscence, no significant erythema, Honeycomb dressing  Uterine Fundus: firm,  involution -1 Lochia: appropriate Skin: Warm, Dry. DVT Evaluation: No evidence of DVT seen on physical exam. Negative Homan's sign. No cords or calf tenderness. No significant calf/ankle edema.  Labs: Recent Labs    05/11/19 0036  HGB 11.5*  HCT 34.9*  WBC 10.8*    CBG (last 3)  No results for input(s): GLUCAP in the last 72 hours.   I/O: I/O last 3 completed shifts: In: 3517.7 [I.V.:3517.7] Out: 100 [Blood:100]   Assessment Postpartum Postoperative Day # 1  Tacy Dura Portal, G2P1001, [redacted]w[redacted]d, S/P repeat LT Cesarean Section due to Teresina Bugaj is a 34 y.o. female G2 P1001 @ 40 6/7 weeks will be induced at 41 weeks for postdates.Pt has a h/o previous cesarean section (documented LTCS w/ 2 layer closure) in 2018 due to a suspected abruption. PNC with [facility] Dion Body) has been complicated by: GBS+ by urine.  Left breast mass that was benign..  Pt stable. -1 Involution.  breastFeeding. Hemodynamically Stable.  Plan: Continue other mgmt as ordered VTE Prophylactics: SCD, ambulated as tolerates.  Pain control: Motrin/Tylenol/Narcotics PRN Education given regarding options for contraception, including IUD placement.  Breastfeeding, Lactation consult and Contraception mirena.   Plan for discharge either POD#2-3  Dr. Sallye Ober to be updated on patient status  [facility] NP-C, CNM 05/12/2019, 5:47 AM

## 2019-05-13 ENCOUNTER — Encounter (HOSPITAL_COMMUNITY): Payer: Self-pay | Admitting: Obstetrics and Gynecology

## 2019-05-13 LAB — BIRTH TISSUE RECOVERY COLLECTION (PLACENTA DONATION)

## 2019-05-13 MED ORDER — IBUPROFEN 800 MG PO TABS: 800.0000 mg | ORAL_TABLET | Freq: Four times a day (QID) | ORAL | 0 refills | Status: DC

## 2019-05-13 MED ORDER — ACETAMINOPHEN 500 MG PO TABS: 1000.0000 mg | ORAL_TABLET | Freq: Four times a day (QID) | ORAL | 0 refills | Status: DC

## 2019-05-13 MED ORDER — OXYCODONE HCL 5 MG PO TABS: 5.0000 mg | ORAL_TABLET | ORAL | 0 refills | Status: AC | PRN

## 2019-05-13 NOTE — Lactation Note (Signed)
This note was copied from a baby's chart. Lactation Consultation Note  Patient Name: [name] [facility] Today's Date: 05/13/2019 Reason for consult: Follow-up assessment;Term;Other (Comment)(4 % weight loss , P2, for D/C today , mom in the shower and asked dad to call on the nurses light when mom done for [facility] .)   Maternal Data    Feeding Feeding Type: Breast Fed  LATCH Score                   Interventions    Lactation Tools Discussed/Used     Consult Status Consult Status: Follow-up Date: 05/13/19 Follow-up type: In-patient    Matilde Sprang Eira Alpert 05/13/2019, 12:19 PM

## 2019-05-13 NOTE — Lactation Note (Signed)
This note was copied from a baby's chart. Lactation Consultation Note  Patient Name: [name] [facility] Today's Date: 05/13/2019 Reason for consult: Follow-up assessment;Term;Other (Comment)(4 % weight loss , P2, for D/C today , mom in the shower and asked dad to call on the nurses light when mom done for [facility] .)   Maternal Data    Feeding Feeding Type: Breast Fed  LATCH Score                   Interventions    Lactation Tools Discussed/Used     Consult Status Consult Status: Follow-up Date: 05/13/19 Follow-up type: In-patient    [name] 05/13/2019, 12:16 PM

## 2019-05-13 NOTE — Lactation Note (Signed)
This note was copied from a baby's chart. Lactation Consultation Note  Patient Name: [name] [facility] Today's Date: 05/13/2019 Reason for consult: Follow-up assessment;Term;Infant weight loss;Nipple pain/trauma;Other (Comment)(milk coming in , increased swallows - Sore nipple tx)  Baby is 23 hours old  LC reviewed and updated the doc flow sheets per mom.  Baby awake and hungry and mom requested to latch on the side She is having more difficulty, and the top part of the nipple pinky red and abrased.  LC assisted with permission to obtain the depth with breast compressions until swallows and depth obtained and the discomfort improved. Baby fed for 20 mins and released on his own  And nipple well rounded.  LC provided comfort gels x 6 days and alternating with shells while awake of sore nipples.  Mom has a hand pump and #24 F and #27 F provided.  Per mom has a DEBP Ameda at home.  LC stressed the importance if soreness has not improved by 4 days to call for Lc O./P for feeding assessment.  LC stressed the importance of making sure the baby is consistently obtaining the depth to  Prevent further soreness.  Sore nipple and engorgement prevention and tx reviewed.  Mom has the [facility] pamphlet with phone numbers and is aware of the virtual support groups.     Maternal Data    Feeding Feeding Type: Breast Fed  LATCH Score Latch: Grasps breast easily, tongue down, lips flanged, rhythmical sucking.  Audible Swallowing: Spontaneous and intermittent  Type of Nipple: Everted at rest and after stimulation  Comfort (Breast/Nipple): Filling, red/small blisters or bruises, mild/mod discomfort  Hold (Positioning): Assistance needed to correctly position infant at breast and maintain latch.  LATCH Score: 8  Interventions Interventions: Breast feeding basics reviewed;Assisted with latch;Skin to skin;Breast massage;Breast compression;Adjust position;Support pillows;Position options;Expressed  milk;Shells;Comfort gels;Hand pump  Lactation Tools Discussed/Used Tools: Shells;Pump;Flanges;Comfort gels Flange Size: 24;27 Shell Type: Inverted Breast pump type: Manual Pump Review: Milk Storage   Consult Status Consult Status: Complete Date: 05/13/19 Follow-up type: In-patient    Matilde Sprang Saud Bail 05/13/2019, 1:29 PM

## 2019-05-13 NOTE — Discharge Summary (Addendum)
OB Discharge Summary     Patient Name: [name] [facility] DOB: 01-Dec-1985 MRN: [id number]  Date of admission: 05/11/2019 Delivering MD: Essie Hart   Date of discharge: 05/13/2019  Admitting diagnosis: Encounter for supervision of other normal pregnancy in third trimester [Z34.83] Intrauterine pregnancy: [redacted]w[redacted]d     Secondary diagnosis:  Active Problems:   Supervision of normal pregnancy   Postpartum care following cesarean delivery  Additional problems: none     Discharge diagnosis: Term Pregnancy Delivered                                                                                                Post partum procedures:none  Augmentation: none  Complications: None  Hospital course:  Scheduled C/S   34 y.o. yo G2P2002 at [redacted]w[redacted]d was admitted to the hospital 05/11/2019 for scheduled cesarean section with the following indication:Elective Repeat.  Membrane Rupture Time/Date: 6:40 PM ,05/11/2019   Patient delivered a Viable infant.05/11/2019  Details of operation can be found in separate operative note.  Pateint had an uncomplicated postpartum course.  She is ambulating, tolerating a regular diet, passing flatus, and urinating well. Patient is discharged home in stable condition on  05/13/19         Physical exam  Vitals:   05/12/19 0630 05/12/19 1500 05/12/19 2123 05/13/19 0605  BP: 95/63 95/62 (!) 87/61 99/68  Pulse: 61 67 87 85  Resp: 18 18 18 18   Temp: 98.3 F (36.8 C) 98.6 F (37 C) 98.4 F (36.9 C) 98.2 F (36.8 C)  TempSrc: Oral Oral Oral Oral  SpO2:   99% 100%  Weight:      Height:       General: alert, cooperative and no distress Lochia: appropriate Uterine Fundus: firm Incision: Healing well with no significant drainage, honeycomb intact DVT Evaluation: No evidence of DVT seen on physical exam. No cords or calf tenderness. No significant calf/ankle edema. Labs: Lab Results  Component Value Date   WBC 19.3 (H) 05/12/2019   HGB 10.9 (L) 05/12/2019   HCT  33.7 (L) 05/12/2019   MCV 98.5 05/12/2019   PLT 155 05/12/2019   CMP Latest Ref Rng & Units 02/20/2018  Glucose 65 - 99 mg/dL 83  BUN 6 - 20 mg/dL 17  Creatinine 04/21/2018 - 2.45 mg/dL 8.09  Sodium 9.83 - 382 mmol/L 141  Potassium 3.5 - 5.2 mmol/L 4.0  Chloride 96 - 106 mmol/L 102  CO2 20 - 29 mmol/L 22  Calcium 8.7 - 10.2 mg/dL 9.0  Total Protein 6.0 - 8.5 g/dL 6.4  Total Bilirubin 0.0 - 1.2 mg/dL 0.6  Alkaline Phos 39 - 117 IU/L 71  AST 0 - 40 IU/L 29  ALT 0 - 32 IU/L 48(H)    Discharge instruction: per After Visit Summary and "Baby and Me Booklet".  After visit meds:  Allergies as of 05/13/2019   No Known Allergies     Medication List    STOP taking these medications   ferrous gluconate 240 (27 FE) MG tablet Commonly known as: FERGON   multivitamin tablet     TAKE these  medications   acetaminophen 500 MG tablet Commonly known as: TYLENOL Take 2 tablets (1,000 mg total) by mouth every 6 (six) hours.   ibuprofen 800 MG tablet Commonly known as: ADVIL Take 1 tablet (800 mg total) by mouth every 6 (six) hours.   oxyCODONE 5 MG immediate release tablet Commonly known as: Oxy IR/ROXICODONE Take 1 tablet (5 mg total) by mouth every 4 (four) hours as needed for up to 3 days for moderate pain.   phenazopyridine 100 MG tablet Commonly known as: Pyridium Take 1 tablet (100 mg total) by mouth 3 (three) times daily as needed for pain.       Diet: routine diet  Activity: Advance as tolerated. Pelvic rest for 6 weeks.   Outpatient follow up:6 weeks Follow up Appt: Future Appointments  Date Time Provider [place]  05/29/2019 10:00 AM Gerri Lins, [facility] None   Follow up Visit:No follow-ups on file.  Postpartum contraception: IUD Mirena, plans at 6 week appt  Newborn Data: Live born female  Birth Weight: 7 lb 9 oz (3430 g) APGAR: 8, 9  Newborn Delivery   Birth date/time: 05/11/2019 18:41:00 Delivery type: C-Section, Low Transverse Trial of labor:  Yes C-section categorization: Repeat      Baby Feeding: Breast Disposition:home with mother   05/13/2019 Arrie Eastern, CNM

## 2019-05-13 NOTE — Progress Notes (Signed)
Subjective: POD# 2 Information for the patient's newborn:  Suleyma, Wafer [[id number]]  female    Baby's Name Elmyra Ricks Circumcision Declines  Reports feeling good, tol reg diet w/o N/V Feeding: breast Reports tolerating PO and denies N/V, foley removed, ambulating and urinating w/o difficulty  Pain controlled with acetaminophen, ibuprofen (OTC) and narcotic analgesics including oxycodone (Oxycontin, Oxyir) Denies HA/SOB/dizziness  Flatus passing Vaginal bleeding is normal, no clots     Objective:  VS:  Vitals:   05/12/19 0630 05/12/19 1500 05/12/19 2123 05/13/19 0605  BP: 95/63 95/62 (!) 87/61 99/68  Pulse: 61 67 87 85  Resp: 18 18 18 18   Temp: 98.3 F (36.8 C) 98.6 F (37 C) 98.4 F (36.9 C) 98.2 F (36.8 C)  TempSrc: Oral Oral Oral Oral  SpO2:   99% 100%  Weight:      Height:        Intake/Output Summary (Last 24 hours) at 05/13/2019 1028 Last data filed at 05/12/2019 1508 Gross per 24 hour  Intake --  Output 900 ml  Net -900 ml     Recent Labs    05/11/19 0036 05/12/19 0527  WBC 10.8* 19.3*  HGB 11.5* 10.9*  HCT 34.9* 33.7*  PLT 193 155    Blood type: --/--/O POS, O POS Performed at [facility], [address] [place], [place]  [phone]/[id number]) Rubella: Immune (09/18 0000)    Physical Exam:  General: alert, cooperative and no distress Abdomen: soft, nontender, normal bowel sounds Incision: clean, dry and intact Uterine Fundus: firm, below umbilicus, nontender Lochia: minimal and no clots Ext: no edema, no calf pain, tenderness, swelling, or cords   Assessment/Plan: 34 y.o.   POD# 2. 34                  Active Problems:   Supervision of normal pregnancy   Routine post-op PP care          Advised warm fluids and ambulation to improve GI motility Encourage rest when baby rests Breastfeeding support Desire D/C today if baby is dc'd  [id number], MSN, CNM 05/13/2019, 10:28 AM

## 2019-05-22 ENCOUNTER — Telehealth (HOSPITAL_COMMUNITY): Payer: Self-pay | Admitting: Lactation Services

## 2019-05-22 NOTE — Telephone Encounter (Signed)
This mom called due to left sore nipple that is very painful to latch. And due to the painful latch she has only been feeding on the right side. Thus far the baby is gaining and satisfied after with  one breast.  LC explored 2 options :  If she wants to try to latch - feed 1st on the right and then off the left in the football position with firm support. ( mom mentioned she feeds on the right breast in the cross cradle position and then switches her arms to the cradle). LC stressed the importance of obtaining consistent depth  With every feeding.  If mom desires to give the left a break - just pump until the nipple is completely healed.  LC recommended to give the left breast a break and pump for 15 -20 mins . LC recommended since the baby is feeding well , and weight is ok ( gaining per mom ),can spoon feed after baby feeds the 1st breast until the left heals.  LC recommended using her EBM to her nipples liberally for healing and eat healthy nutritious foods for healing.  LC stressed the importance of protecting her milk supply by pumping.  LC recommended if soreness not improved by these recommendations by tomorrow midday to call for [facility] O/P appt ( #given to mom ).  Per mom has a DEBP at home .  LC recommended for dad to purchase a HAKKA for pre- pumping to stretch the nipple /areola complex.  LC encouraged mom to call back with questions.

## 2019-05-29 ENCOUNTER — Ambulatory Visit (INDEPENDENT_AMBULATORY_CARE_PROVIDER_SITE_OTHER): Payer: BC Managed Care – PPO | Admitting: Psychology

## 2019-05-29 DIAGNOSIS — F4322 Adjustment disorder with anxiety: Secondary | ICD-10-CM | POA: Diagnosis not present

## 2019-06-12 DIAGNOSIS — M545 Low back pain: Secondary | ICD-10-CM | POA: Diagnosis not present

## 2019-06-12 DIAGNOSIS — M62830 Muscle spasm of back: Secondary | ICD-10-CM | POA: Diagnosis not present

## 2019-06-12 DIAGNOSIS — M9905 Segmental and somatic dysfunction of pelvic region: Secondary | ICD-10-CM | POA: Diagnosis not present

## 2019-06-12 DIAGNOSIS — M9903 Segmental and somatic dysfunction of lumbar region: Secondary | ICD-10-CM | POA: Diagnosis not present

## 2019-06-22 DIAGNOSIS — Z3202 Encounter for pregnancy test, result negative: Secondary | ICD-10-CM | POA: Diagnosis not present

## 2019-06-27 DIAGNOSIS — M545 Low back pain: Secondary | ICD-10-CM | POA: Diagnosis not present

## 2019-06-27 DIAGNOSIS — M9903 Segmental and somatic dysfunction of lumbar region: Secondary | ICD-10-CM | POA: Diagnosis not present

## 2019-06-27 DIAGNOSIS — M62830 Muscle spasm of back: Secondary | ICD-10-CM | POA: Diagnosis not present

## 2019-06-27 DIAGNOSIS — M9905 Segmental and somatic dysfunction of pelvic region: Secondary | ICD-10-CM | POA: Diagnosis not present

## 2019-07-03 ENCOUNTER — Ambulatory Visit (INDEPENDENT_AMBULATORY_CARE_PROVIDER_SITE_OTHER): Payer: BC Managed Care – PPO | Admitting: Psychology

## 2019-07-03 DIAGNOSIS — F4322 Adjustment disorder with anxiety: Secondary | ICD-10-CM

## 2019-07-04 DIAGNOSIS — M9905 Segmental and somatic dysfunction of pelvic region: Secondary | ICD-10-CM | POA: Diagnosis not present

## 2019-07-04 DIAGNOSIS — M545 Low back pain: Secondary | ICD-10-CM | POA: Diagnosis not present

## 2019-07-04 DIAGNOSIS — M62830 Muscle spasm of back: Secondary | ICD-10-CM | POA: Diagnosis not present

## 2019-07-04 DIAGNOSIS — M9903 Segmental and somatic dysfunction of lumbar region: Secondary | ICD-10-CM | POA: Diagnosis not present

## 2019-07-06 DIAGNOSIS — Z3202 Encounter for pregnancy test, result negative: Secondary | ICD-10-CM | POA: Diagnosis not present

## 2019-07-06 DIAGNOSIS — Z30430 Encounter for insertion of intrauterine contraceptive device: Secondary | ICD-10-CM | POA: Diagnosis not present

## 2019-08-07 ENCOUNTER — Ambulatory Visit (INDEPENDENT_AMBULATORY_CARE_PROVIDER_SITE_OTHER): Payer: BC Managed Care – PPO | Admitting: Psychology

## 2019-08-07 DIAGNOSIS — F4322 Adjustment disorder with anxiety: Secondary | ICD-10-CM | POA: Diagnosis not present

## 2019-08-17 ENCOUNTER — Ambulatory Visit
Admission: RE | Admit: 2019-08-17 | Discharge: 2019-08-17 | Disposition: A | Discharge status: Home / Self Care | Payer: BC Managed Care – PPO | Source: Ambulatory Visit | Attending: Obstetrics and Gynecology | Admitting: Obstetrics and Gynecology

## 2019-08-17 ENCOUNTER — Other Ambulatory Visit: Payer: Self-pay | Admitting: Obstetrics and Gynecology

## 2019-08-17 DIAGNOSIS — Z30431 Encounter for routine checking of intrauterine contraceptive device: Secondary | ICD-10-CM | POA: Diagnosis not present

## 2019-08-17 DIAGNOSIS — T8332XA Displacement of intrauterine contraceptive device, initial encounter: Secondary | ICD-10-CM | POA: Diagnosis not present

## 2019-08-17 DIAGNOSIS — K5900 Constipation, unspecified: Secondary | ICD-10-CM | POA: Diagnosis not present

## 2019-08-21 ENCOUNTER — Other Ambulatory Visit: Payer: Self-pay | Admitting: Obstetrics and Gynecology

## 2019-08-21 ENCOUNTER — Other Ambulatory Visit (HOSPITAL_COMMUNITY): Payer: Self-pay | Admitting: Obstetrics and Gynecology

## 2019-08-21 ENCOUNTER — Other Ambulatory Visit: Payer: Self-pay

## 2019-08-21 ENCOUNTER — Encounter (HOSPITAL_BASED_OUTPATIENT_CLINIC_OR_DEPARTMENT_OTHER): Payer: Self-pay | Admitting: Obstetrics and Gynecology

## 2019-08-21 DIAGNOSIS — M62830 Muscle spasm of back: Secondary | ICD-10-CM | POA: Diagnosis not present

## 2019-08-21 DIAGNOSIS — M545 Low back pain: Secondary | ICD-10-CM | POA: Diagnosis not present

## 2019-08-21 DIAGNOSIS — M9903 Segmental and somatic dysfunction of lumbar region: Secondary | ICD-10-CM | POA: Diagnosis not present

## 2019-08-21 DIAGNOSIS — T8332XA Displacement of intrauterine contraceptive device, initial encounter: Secondary | ICD-10-CM

## 2019-08-21 DIAGNOSIS — M9905 Segmental and somatic dysfunction of pelvic region: Secondary | ICD-10-CM | POA: Diagnosis not present

## 2019-08-23 ENCOUNTER — Other Ambulatory Visit (HOSPITAL_COMMUNITY): Payer: BC Managed Care – PPO | Attending: Obstetrics and Gynecology

## 2019-08-24 ENCOUNTER — Other Ambulatory Visit
Admission: RE | Admit: 2019-08-24 | Discharge: 2019-08-24 | Disposition: A | Discharge status: Home / Self Care | Payer: BC Managed Care – PPO | Source: Ambulatory Visit | Attending: Obstetrics and Gynecology | Admitting: Obstetrics and Gynecology

## 2019-08-24 ENCOUNTER — Other Ambulatory Visit: Payer: Self-pay

## 2019-08-24 DIAGNOSIS — Z20822 Contact with and (suspected) exposure to covid-19: Secondary | ICD-10-CM | POA: Diagnosis not present

## 2019-08-24 DIAGNOSIS — Z01812 Encounter for preprocedural laboratory examination: Secondary | ICD-10-CM | POA: Insufficient documentation

## 2019-08-24 NOTE — Progress Notes (Addendum)
Left message for pt to go get covid test by 3pm today.  Pt stated she went for covid test today.

## 2019-08-25 LAB — SARS CORONAVIRUS 2 (TAT 6-24 HRS): SARS Coronavirus 2: NEGATIVE

## 2019-08-27 ENCOUNTER — Encounter (HOSPITAL_BASED_OUTPATIENT_CLINIC_OR_DEPARTMENT_OTHER): Payer: Self-pay | Admitting: Obstetrics and Gynecology

## 2019-08-27 ENCOUNTER — Ambulatory Visit (HOSPITAL_COMMUNITY)
Admission: RE | Admit: 2019-08-27 | Discharge: 2019-08-27 | Disposition: A | Discharge status: Home / Self Care | Payer: BC Managed Care – PPO | Source: Ambulatory Visit | Attending: Obstetrics and Gynecology | Admitting: Obstetrics and Gynecology

## 2019-08-27 ENCOUNTER — Ambulatory Visit (HOSPITAL_BASED_OUTPATIENT_CLINIC_OR_DEPARTMENT_OTHER)
Admission: RE | Admit: 2019-08-27 | Discharge: 2019-08-27 | Disposition: A | Discharge status: Home / Self Care | Payer: BC Managed Care – PPO | Attending: Obstetrics and Gynecology | Admitting: Obstetrics and Gynecology

## 2019-08-27 ENCOUNTER — Ambulatory Visit (HOSPITAL_BASED_OUTPATIENT_CLINIC_OR_DEPARTMENT_OTHER): Payer: BC Managed Care – PPO | Admitting: Anesthesiology

## 2019-08-27 ENCOUNTER — Encounter (HOSPITAL_BASED_OUTPATIENT_CLINIC_OR_DEPARTMENT_OTHER)
Admission: RE | Disposition: A | Discharge status: Home / Self Care | Payer: Self-pay | Source: Home / Self Care | Attending: Obstetrics and Gynecology

## 2019-08-27 DIAGNOSIS — T8332XA Displacement of intrauterine contraceptive device, initial encounter: Secondary | ICD-10-CM | POA: Diagnosis not present

## 2019-08-27 DIAGNOSIS — K660 Peritoneal adhesions (postprocedural) (postinfection): Secondary | ICD-10-CM | POA: Insufficient documentation

## 2019-08-27 DIAGNOSIS — T839XXA Unspecified complication of genitourinary prosthetic device, implant and graft, initial encounter: Secondary | ICD-10-CM

## 2019-08-27 DIAGNOSIS — Z30433 Encounter for removal and reinsertion of intrauterine contraceptive device: Secondary | ICD-10-CM | POA: Diagnosis not present

## 2019-08-27 DIAGNOSIS — Z87891 Personal history of nicotine dependence: Secondary | ICD-10-CM | POA: Diagnosis not present

## 2019-08-27 DIAGNOSIS — T8332XD Displacement of intrauterine contraceptive device, subsequent encounter: Secondary | ICD-10-CM | POA: Diagnosis not present

## 2019-08-27 DIAGNOSIS — Y831 Surgical operation with implant of artificial internal device as the cause of abnormal reaction of the patient, or of later complication, without mention of misadventure at the time of the procedure: Secondary | ICD-10-CM | POA: Diagnosis not present

## 2019-08-27 HISTORY — PX: INTRAUTERINE DEVICE (IUD) INSERTION: SHX5877

## 2019-08-27 HISTORY — PX: IUD REMOVAL: SHX5392

## 2019-08-27 HISTORY — PX: OPERATIVE ULTRASOUND: SHX5996

## 2019-08-27 HISTORY — PX: LAPAROSCOPY: SHX197

## 2019-08-27 LAB — POCT PREGNANCY, URINE: Preg Test, Ur: NEGATIVE

## 2019-08-27 SURGERY — LAPAROSCOPY, DIAGNOSTIC
Anesthesia: General | Site: Uterus

## 2019-08-27 MED ORDER — LEVONORGESTREL 19.5 MCG/DAY IU IUD
INTRAUTERINE_SYSTEM | INTRAUTERINE | Status: AC
  Filled 2019-08-27: qty 1

## 2019-08-27 MED ORDER — FENTANYL CITRATE (PF) 100 MCG/2ML IJ SOLN
INTRAMUSCULAR | Status: AC
  Filled 2019-08-27: qty 2

## 2019-08-27 MED ORDER — ONDANSETRON HCL 4 MG/2ML IJ SOLN
INTRAMUSCULAR | Status: AC
  Filled 2019-08-27: qty 2

## 2019-08-27 MED ORDER — MIDAZOLAM HCL 2 MG/2ML IJ SOLN
INTRAMUSCULAR | Status: AC
  Filled 2019-08-27: qty 2

## 2019-08-27 MED ORDER — LEVONORGESTREL 20 MCG/24HR IU IUD
INTRAUTERINE_SYSTEM | INTRAUTERINE | Status: AC
  Administered 2019-08-27: 52 mg via INTRAUTERINE
  Filled 2019-08-27: qty 2

## 2019-08-27 MED ORDER — SODIUM CHLORIDE 0.9 % IV SOLN
INTRAVENOUS | Status: AC | PRN
  Administered 2019-08-27: 1000 mL via INTRAMUSCULAR

## 2019-08-27 MED ORDER — PROPOFOL 10 MG/ML IV BOLUS
INTRAVENOUS | Status: DC | PRN
  Administered 2019-08-27: 150 mg via INTRAVENOUS

## 2019-08-27 MED ORDER — CEFAZOLIN SODIUM-DEXTROSE 2-4 GM/100ML-% IV SOLN
2.0000 g | INTRAVENOUS | Status: AC
  Administered 2019-08-27: 2 g via INTRAVENOUS

## 2019-08-27 MED ORDER — ROCURONIUM BROMIDE 10 MG/ML (PF) SYRINGE
PREFILLED_SYRINGE | INTRAVENOUS | Status: AC
  Filled 2019-08-27: qty 10

## 2019-08-27 MED ORDER — DEXAMETHASONE SODIUM PHOSPHATE 4 MG/ML IJ SOLN
INTRAMUSCULAR | Status: DC | PRN
  Administered 2019-08-27: 10 mg via INTRAVENOUS

## 2019-08-27 MED ORDER — POVIDONE-IODINE 10 % EX SWAB
2.0000 "application " | Freq: Once | CUTANEOUS | Status: AC
  Administered 2019-08-27: 2 via TOPICAL

## 2019-08-27 MED ORDER — CEFAZOLIN SODIUM-DEXTROSE 2-4 GM/100ML-% IV SOLN
INTRAVENOUS | Status: AC
  Filled 2019-08-27: qty 100

## 2019-08-27 MED ORDER — OXYCODONE HCL 5 MG PO TABS: 5.0000 mg | ORAL_TABLET | Freq: Once | ORAL | Status: DC | PRN

## 2019-08-27 MED ORDER — OXYCODONE HCL 5 MG PO TABS: 5.0000 mg | ORAL_TABLET | Freq: Once | ORAL | 0 refills | Status: DC | PRN

## 2019-08-27 MED ORDER — LIDOCAINE HCL (CARDIAC) PF 100 MG/5ML IV SOSY
PREFILLED_SYRINGE | INTRAVENOUS | Status: DC | PRN
  Administered 2019-08-27: 20 mg via INTRAVENOUS

## 2019-08-27 MED ORDER — ONDANSETRON HCL 4 MG/2ML IJ SOLN
INTRAMUSCULAR | Status: DC | PRN
  Administered 2019-08-27: 4 mg via INTRAVENOUS

## 2019-08-27 MED ORDER — PHENYLEPHRINE 40 MCG/ML (10ML) SYRINGE FOR IV PUSH (FOR BLOOD PRESSURE SUPPORT)
PREFILLED_SYRINGE | INTRAVENOUS | Status: AC
  Filled 2019-08-27: qty 10

## 2019-08-27 MED ORDER — SUGAMMADEX SODIUM 200 MG/2ML IV SOLN
INTRAVENOUS | Status: DC | PRN
  Administered 2019-08-27: 150 mg via INTRAVENOUS

## 2019-08-27 MED ORDER — EPHEDRINE 5 MG/ML INJ
INTRAVENOUS | Status: AC
  Filled 2019-08-27: qty 10

## 2019-08-27 MED ORDER — FENTANYL CITRATE (PF) 100 MCG/2ML IJ SOLN
25.0000 ug | INTRAMUSCULAR | Status: DC | PRN
  Administered 2019-08-27 (×2): 50 ug via INTRAVENOUS

## 2019-08-27 MED ORDER — FENTANYL CITRATE (PF) 100 MCG/2ML IJ SOLN
INTRAMUSCULAR | Status: DC | PRN
  Administered 2019-08-27: 100 ug via INTRAVENOUS

## 2019-08-27 MED ORDER — MIDAZOLAM HCL 5 MG/5ML IJ SOLN
INTRAMUSCULAR | Status: DC | PRN
  Administered 2019-08-27: 2 mg via INTRAVENOUS

## 2019-08-27 MED ORDER — IBUPROFEN 600 MG PO TABS: 600.0000 mg | ORAL_TABLET | Freq: Four times a day (QID) | ORAL | 0 refills | Status: DC | PRN

## 2019-08-27 MED ORDER — DEXAMETHASONE SODIUM PHOSPHATE 10 MG/ML IJ SOLN
INTRAMUSCULAR | Status: AC
  Filled 2019-08-27: qty 1

## 2019-08-27 MED ORDER — LACTATED RINGERS IV SOLN: INTRAVENOUS | Status: DC

## 2019-08-27 MED ORDER — SUCCINYLCHOLINE CHLORIDE 200 MG/10ML IV SOSY
PREFILLED_SYRINGE | INTRAVENOUS | Status: AC
  Filled 2019-08-27: qty 10

## 2019-08-27 MED ORDER — BUPIVACAINE HCL (PF) 0.25 % IJ SOLN
INTRAMUSCULAR | Status: DC | PRN
  Administered 2019-08-27: 17 mL

## 2019-08-27 MED ORDER — OXYCODONE HCL 5 MG/5ML PO SOLN: 5.0000 mg | Freq: Once | ORAL | Status: DC | PRN

## 2019-08-27 MED ORDER — SILVER NITRATE-POT NITRATE 75-25 % EX MISC
CUTANEOUS | Status: DC | PRN
  Administered 2019-08-27: 2

## 2019-08-27 MED ORDER — ROCURONIUM BROMIDE 100 MG/10ML IV SOLN
INTRAVENOUS | Status: DC | PRN
  Administered 2019-08-27: 50 mg via INTRAVENOUS
  Administered 2019-08-27: 10 mg via INTRAVENOUS

## 2019-08-27 MED ORDER — LIDOCAINE HCL 2 % IJ SOLN
INTRAMUSCULAR | Status: DC | PRN
  Administered 2019-08-27: 10 mL

## 2019-08-27 MED ORDER — DIPHENHYDRAMINE HCL 50 MG/ML IJ SOLN
INTRAMUSCULAR | Status: AC
  Filled 2019-08-27: qty 1

## 2019-08-27 MED ORDER — LIDOCAINE 2% (20 MG/ML) 5 ML SYRINGE
INTRAMUSCULAR | Status: AC
  Filled 2019-08-27: qty 5

## 2019-08-27 MED ORDER — ONDANSETRON HCL 4 MG/2ML IJ SOLN: 4.0000 mg | Freq: Once | INTRAMUSCULAR | Status: DC | PRN

## 2019-08-27 SURGICAL SUPPLY — 50 items
ADH SKN CLS APL DERMABOND .7 (GAUZE/BANDAGES/DRESSINGS) ×3
APL SRG 38 LTWT LNG FL B (MISCELLANEOUS)
APPLICATOR ARISTA FLEXITIP XL (MISCELLANEOUS) IMPLANT
BAG SPEC RTRVL LRG 6X4 10 (ENDOMECHANICALS)
BRIEF STRETCH FOR OB PAD XXL (UNDERPADS AND DIAPERS) ×5 IMPLANT
CABLE HIGH FREQUENCY MONO STRZ (ELECTRODE) IMPLANT
CATH ROBINSON RED A/P 16FR (CATHETERS) IMPLANT
DERMABOND ADVANCED (GAUZE/BANDAGES/DRESSINGS) ×2
DERMABOND ADVANCED .7 DNX12 (GAUZE/BANDAGES/DRESSINGS) ×3 IMPLANT
DILATOR CANAL MILEX (MISCELLANEOUS) ×5 IMPLANT
DRSG OPSITE POSTOP 3X4 (GAUZE/BANDAGES/DRESSINGS) IMPLANT
DURAPREP 26ML APPLICATOR (WOUND CARE) ×5 IMPLANT
ELECT REM PT RETURN 9FT ADLT (ELECTROSURGICAL) ×5
ELECTRODE REM PT RTRN 9FT ADLT (ELECTROSURGICAL) ×3 IMPLANT
FORCEPS CUTTING 33CM 5MM (CUTTING FORCEPS) IMPLANT
FORCEPS CUTTING 45CM 5MM (CUTTING FORCEPS) IMPLANT
GLOVE BIO SURGEON STRL SZ7 (GLOVE) ×5 IMPLANT
GLOVE BIOGEL PI IND STRL 7.0 (GLOVE) ×15 IMPLANT
GLOVE BIOGEL PI IND STRL 7.5 (GLOVE) ×3 IMPLANT
GLOVE BIOGEL PI INDICATOR 7.0 (GLOVE) ×10
GLOVE BIOGEL PI INDICATOR 7.5 (GLOVE) ×2
GOWN STRL REUS W/ TWL LRG LVL3 (GOWN DISPOSABLE) ×3 IMPLANT
GOWN STRL REUS W/TWL LRG LVL3 (GOWN DISPOSABLE) ×15 IMPLANT
HEMOSTAT ARISTA ABSORB 3G PWDR (HEMOSTASIS) IMPLANT
IV NS 1000ML (IV SOLUTION)
IV NS 1000ML BAXH (IV SOLUTION) IMPLANT
Mirena IUD 52mg ×5 IMPLANT
NEEDLE SPNL 22GX3.5 QUINCKE BK (NEEDLE) ×5 IMPLANT
PACK LAPAROSCOPY BASIN (CUSTOM PROCEDURE TRAY) ×5 IMPLANT
PACK TRENDGUARD 450 HYBRID PRO (MISCELLANEOUS) ×3 IMPLANT
PACK TRENDGUARD 600 HYBRD PROC (MISCELLANEOUS) IMPLANT
PACK VAGINAL MINOR WOMEN LF (CUSTOM PROCEDURE TRAY) IMPLANT
PAD OB MATERNITY 4.3X12.25 (PERSONAL CARE ITEMS) ×5 IMPLANT
PAD PREP 24X48 CUFFED NSTRL (MISCELLANEOUS) ×5 IMPLANT
POUCH SPECIMEN RETRIEVAL 10MM (ENDOMECHANICALS) IMPLANT
SCISSORS LAP 5X35 DISP (ENDOMECHANICALS) IMPLANT
SET IRRIG TUBING LAPAROSCOPIC (IRRIGATION / IRRIGATOR) ×5 IMPLANT
SET TUBE SMOKE EVAC HIGH FLOW (TUBING) ×5 IMPLANT
SHEARS HARMONIC ACE PLUS 36CM (ENDOMECHANICALS) ×5 IMPLANT
SLEEVE SCD COMPRESS KNEE MED (MISCELLANEOUS) ×5 IMPLANT
SLEEVE XCEL OPT CAN 5 100 (ENDOMECHANICALS) ×10 IMPLANT
SUT MNCRL AB 4-0 PS2 18 (SUTURE) ×5 IMPLANT
SUT VICRYL 0 UR6 27IN ABS (SUTURE) ×5 IMPLANT
TOWEL GREEN STERILE FF (TOWEL DISPOSABLE) ×10 IMPLANT
TRAY FOLEY W/BAG SLVR 14FR LF (SET/KITS/TRAYS/PACK) ×5 IMPLANT
TRENDGUARD 450 HYBRID PRO PACK (MISCELLANEOUS) ×5
TRENDGUARD 600 HYBRID PROC PK (MISCELLANEOUS)
TROCAR XCEL NON-BLD 11X100MML (ENDOMECHANICALS) IMPLANT
TROCAR XCEL NON-BLD 5MMX100MML (ENDOMECHANICALS) ×5 IMPLANT
WARMER LAPAROSCOPE (MISCELLANEOUS) ×5 IMPLANT

## 2019-08-27 NOTE — Transfer of Care (Signed)
Immediate Anesthesia Transfer of Care Note  Patient: [name]  Procedure(s) Performed: LAPAROSCOPY DIAGNOSTIC (N/A Abdomen) INTRAUTERINE DEVICE (IUD) INSERTION (N/A Uterus) INTRAUTERINE DEVICE (IUD) REMOVAL (N/A Pelvis) OPERATIVE ULTRASOUND (N/A Abdomen)  Patient Location: PACU  Anesthesia Type:General  Level of Consciousness: drowsy, patient cooperative and responds to stimulation  Airway & Oxygen Therapy: Patient Spontanous Breathing and Patient connected to face mask oxygen  Post-op Assessment: Report given to RN and Post -op Vital signs reviewed and stable  Post vital signs: Reviewed and stable  Last Vitals:  Vitals Value Taken Time  BP    Temp    Pulse 122 08/27/19 1500  Resp 18 08/27/19 1500  SpO2 100 % 08/27/19 1500  Vitals shown include unvalidated device data.  Last Pain:  Vitals:   08/27/19 1126  TempSrc: Oral  PainSc: 0-No pain         Complications: No complications documented.

## 2019-08-27 NOTE — Interval H&P Note (Signed)
History and Physical Interval Note:  08/27/2019 1:25 PM  [name]  has presented today for surgery, with the diagnosis of T83.32XA Displacement of IUD.  The various methods of treatment have been discussed with the patient and family. After consideration of risks, benefits and other options for treatment, the patient has consented to  Procedure(s): LAPAROSCOPY DIAGNOSTIC (N/A) INTRAUTERINE DEVICE (IUD) INSERTION (N/A) INTRAUTERINE DEVICE (IUD) REMOVAL (N/A) OPERATIVE ULTRASOUND (N/A) as a surgical intervention.  The patient's history has been reviewed, patient examined, no change in status, stable for surgery.  I have reviewed the patient's chart and labs.  Questions were answered to the patient's satisfaction.     Geryl Rankins

## 2019-08-27 NOTE — Brief Op Note (Signed)
08/27/2019  3:32 PM  PATIENT:  [name]  34 y.o. female  PRE-OPERATIVE DIAGNOSIS:  T83.32XA Displacement of IUD  POST-OPERATIVE DIAGNOSIS:  T83.32XA Displacement of IUD, abdominal adhesions  PROCEDURE:  Procedure(s): LAPAROSCOPY DIAGNOSTIC (N/A) INTRAUTERINE DEVICE (IUD) INSERTION (N/A) INTRAUTERINE DEVICE (IUD) REMOVAL (N/A) OPERATIVE ULTRASOUND (N/A)  SURGEON:  Surgeon(s) and Role:    Geryl Rankins, MD - Primary  PHYSICIAN ASSISTANT:   ASSISTANTS: Steva Ready, DO   ANESTHESIA:   local, general and paracervical block  EBL:  10 mL   BLOOD ADMINISTERED:none  DRAINS: Urinary Catheter (Foley)   LOCAL MEDICATIONS USED:  0.25% MARCAINE    and 2% LIDOCAINE with epinephrine  SPECIMEN:  No Specimen  DISPOSITION OF SPECIMEN:  N/A  COUNTS:  YES  TOURNIQUET:  * No tourniquets in log *  DICTATION: .Other Dictation: Dictation Number [id number]  PLAN OF CARE: Discharge to home after PACU  PATIENT DISPOSITION:  PACU - hemodynamically stable.   Delay start of Pharmacological VTE agent (>24hrs) due to surgical blood loss or risk of bleeding: not applicable

## 2019-08-27 NOTE — Anesthesia Procedure Notes (Signed)
Procedure Name: Intubation Date/Time: 08/27/2019 1:41 PM Performed by: Willa Frater, CRNA Pre-anesthesia Checklist: Patient identified, Emergency Drugs available, Suction available and Patient being monitored Patient Re-evaluated:Patient Re-evaluated prior to induction Oxygen Delivery Method: Circle system utilized Preoxygenation: Pre-oxygenation with 100% oxygen Induction Type: IV induction Ventilation: Mask ventilation without difficulty Laryngoscope Size: Mac and 3 Grade View: Grade I Tube type: Oral Tube size: 7.0 mm Number of attempts: 1 Airway Equipment and Method: Stylet and Oral airway Placement Confirmation: ETT inserted through vocal cords under direct vision,  positive ETCO2 and breath sounds checked- equal and bilateral Secured at: 22 cm Tube secured with: Tape Dental Injury: Teeth and Oropharynx as per pre-operative assessment

## 2019-08-27 NOTE — Anesthesia Preprocedure Evaluation (Signed)

## 2019-08-27 NOTE — Discharge Instructions (Signed)

## 2019-08-27 NOTE — H&P (Signed)
History of Present Illness  General:          34 y/o presents for pelvic ultrasound to check IUD placement.        Mirena IUD inserted 06/22/2019. Pelvic ultrasound revealed no IUD present in uterus. Patient denies bleeding. 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        Does patient report new onset of COVID symptoms? No.        Has patient or close contact tested positive for COVID-19? No , not in the past 2 weeks.      Current Medications  Taking  .Newman's Nipple Ointment with Ibuprofen Mupirocin 2% Ointment, Betamethasone 0.1% Ointment, Miconazole 2% Powder, Ibuprofen 2% Compound Ointment Apply it sparingly topical after each feeding, Notes: prn    .PNV     .iron 1 tab Oral     .Probiotic - Capsule 1 capsule Orally 3-4 times a week    .Calcium 1 tab Oral    Not-Taking  .Cytotec(miSOPROStol) 200 MCG Tablet 1 tablet Vaginal 12 and 24 hours prior to procedure    Medication List reviewed and reconciled with the patient       Past Medical History        Breast Feeding: yes.              Surgical History        Csection 02/2016        Wisdom teeth 2012        Csection 05/11/2019       Family History   Father: alive   Mother: alive, injury indused DVT   Paternal Grand Father: deceased, Smoker, drinker, diagnosed with Lung Cancer   Paternal Grand Mother: deceased   Maternal Grand Father: alive, Skin cancer, MI late 54s, fibrosis of the lung   Maternal Grand Mother: deceased, pulmonary fibrosis   Maternal aunt: Skin cancer   pt unsure of family history but will update at next visit per pt.  family history of skin cancers/heart issues. Maternal great grandmother ovarian cancer, other maternal great grandmother breast cancer.       Social History  General:        Tobacco use             cigarettes: Former smoker           Quit in year 2009           Tobacco history last updated 08/17/2019            Vaping No      no Alcohol.       no Caffeine.       no Recreational drug use.       Marital Status: married.       Children: 1, Boys.       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      Gyn History  Sexual activity currently sexually active.   Periods : irregular.   LMP 07-29-2018 .   Birth control Mirena IUD.   Last pap smear date 02/28/2018-Neg/HPV neg.   Denies H/O Last mammogram date.   Denies H/O Abnormal pap smear no.   Denies H/O STD none.   Menarche 15.       OB History  Number of pregnancies 1.   Pregnancy # 1 live birth, C-section delivery, 02/2016.       Allergies   Bee stings       Hospitalization/Major Diagnostic Procedure  c-section 02-2016   childbirth 05/11/2019       Review of Systems  See scanned ROS form for details.     Vital Signs  Wt 146.2, Wt change -3 lb, Ht 70, BMI 20.98, Pulse sitting 88, BP sitting 122/70.     Physical Examination  Chaperone present:         Chaperone present  Whitfield,Dia 08/17/2019 12:25:39 PM > , for pelvic exam.   GENERAL:         Patient appears  alert and oriented.         General Appearance:  well-appearing, well-developed, no acute distress.         Speech:  clear.   FEMALE GENITOURINARY:         Cervix  visualized, healthy appearing, no discharge, no lesions.         Vagina:  pink/moist mucosa, no lesions, no abnormal discharge.         Vulva:  normal, no lesions, no skin discoloration.      Pt aware of scribe services today.     Assessments     1. Checking of intrauterine device - Z30.431 (Primary)   2. History of use of contraceptive intrauterine device (IUD) - Z92.0   3. Displacement of intrauterine contraceptive device, initial encounter - T83.32XA        Treatment   1. Checking of intrauterine device        Imaging: US ECHO TRANSVAGINAL Notes: Mirena IUD inserted 06/22/2019. Pelvic ultrasound revealed no IUD present in uterus. Patient denies bleeding. Recommended further imaging to locate IUD.       2. Displacement of intrauterine contraceptive device, initial encounter            Referral KX:[facility]               Reason:Abdominal xray (KUB)Patient will walk in        Procedures  Scribe Documentation:         Attestation:  I personally scribed for Dr. Dion Body on the date of this appointment. Electronically signed by scribe , Jerilynn Birkenhead 08/17/2019 11:53:22 AM > .             Visit Codes   99213 OV LEVEL 3.    Follow Up  prn

## 2019-08-28 ENCOUNTER — Encounter (HOSPITAL_BASED_OUTPATIENT_CLINIC_OR_DEPARTMENT_OTHER): Payer: Self-pay | Admitting: Obstetrics and Gynecology

## 2019-08-28 NOTE — Anesthesia Postprocedure Evaluation (Signed)
Anesthesia Post Note  Patient: [name]  Procedure(s) Performed: LAPAROSCOPY DIAGNOSTIC (N/A Abdomen) INTRAUTERINE DEVICE (IUD) INSERTION (N/A Uterus) INTRAUTERINE DEVICE (IUD) REMOVAL (N/A Pelvis) OPERATIVE ULTRASOUND (N/A Abdomen)     Patient location during evaluation: PACU Anesthesia Type: General Level of consciousness: awake and alert Pain management: pain level controlled Vital Signs Assessment: post-procedure vital signs reviewed and stable Respiratory status: spontaneous breathing, nonlabored ventilation, respiratory function stable and patient connected to nasal cannula oxygen Cardiovascular status: blood pressure returned to baseline and stable Postop Assessment: no apparent nausea or vomiting Anesthetic complications: no   No complications documented.  Last Vitals:  Vitals:   08/27/19 1600 08/27/19 1630  BP: 99/65 105/75  Pulse: 84 94  Resp: 16 16  Temp:  36.8 C  SpO2: 98% 100%    Last Pain:  Vitals:   08/27/19 1630  TempSrc:   PainSc: 1                  Minnetta Sandora COKER

## 2019-08-28 NOTE — Op Note (Deleted)
  The note originally documented on this encounter has been moved the the encounter in which it belongs.  

## 2019-08-28 NOTE — Op Note (Signed)
[name], [name] MEDICAL RECORD SN:[id number] ACCOUNT [id number] DATE OF BIRTH:08/25/1985 FACILITY: [facility] LOCATION: [facility] PHYSICIAN:Danetra Glock Derrell Lolling, MD  OPERATIVE REPORT  DATE OF PROCEDURE:  08/27/2019  PREOPERATIVE DIAGNOSES:  Malpositioned intrauterine device.  POSTOPERATIVE DIAGNOSES:  Malpositioned intrauterine device.  Abdominal adhesions.  PROCEDURE:  Operative laparoscopy intrauterine device removal (abdominally) and then IUD insertion under ultrasound guidance.  SURGEON:  Geryl Rankins, MD  ASSISTANT:  Steva Ready, MD   ANESTHESIA:  Local, general, and paracervical block.  ESTIMATED BLOOD LOSS:  10 mL.  BLOOD ADMINISTERED:  None.  DRAINS:  Foley catheter.  LOCAL:  With 0.25% Marcaine for the abdominal incision, and 2% lidocaine with epinephrine for the paracervical block.  SPECIMENS:  None.  DISPOSITION:  To PACU, hemodynamically stable.  COMPLICATIONS:  None.  FINDINGS:  IUD in the posterior cul-de-sac on the left pelvic sidewall encased in some thin peritoneal adhesions and 1 string adhesion to the colon.  Omental adhesions down the midline of the abdomen.  Normal uterus, fallopian tubes and ovaries and  normal liver edge.  DESCRIPTION OF PROCEDURE:  The patient was identified in the holding area.  She was then taken to the operating room with IV running.  Patient was placed in the dorsal lithotomy position.  She was prepped in a normal fashion vaginally and abdominally.  A  timeout was performed.  Ancef 2 grams IV was administered.  SCDs were on her legs and operating.  Once a timeout was performed, I performed a bimanual exam, which revealed an anteverted uterus.  The Graves speculum was inserted in the vagina and a  single tooth tenaculum was used to grasp the anterior lip of the cervix and the acorn uterine manipulator was advanced through the cervical canal.  Graves speculum was removed and the patient was then draped.  Marcaine was used  at the periumbilical incision.  Anterior lip of the cervix was grasped with a single tooth tenaculum and the uterine acorn manipulator was advanced through the cervical canal.  The Graves speculum was removed and attention was turned to  the abdomen after the patient was draped.  Marcaine was used to inject the infraumbilical site of the incision.  A vertical incision was made in the umbilicus, 5 mm.  A 5 mm trocar with the camera was advanced through the abdomen under direct visualization.  CO2 gas was then used to insufflate  the abdomen and the adhesions above were noted.  5 mm trocar ports were then placed in the right and left lower quadrant under direct visualization.  Transillumination was performed to make sure there was no injury to the epigastric vessels and 0.25%  Marcaine was used to inject each incision prior to insertion of the trocar.  Once we were in the abdomen the omental adhesion went vertically down the midline and separated the right from the left abdomen, so we had to use the lower port to adequately visualize the IUD, which was in the lower pelvis.  Once we were able to clearly  identify that we were able to see that the IUD was out of the uterus and was in the pelvis and was encased in some thin peritoneal adhesions.  We would use [place] graspers to manipulate the IUD.  There was an encasing with a stringy adhesion connected  to the colon.  At that time, it was decided that in order to safely remove it without pulling and tearing any surrounding tissue that we would have to cut the encased tissue from around  the IUD to free it up.  The Harmonic scalpel was then easily advanced.  The adhesion to the bowel was ligated on the end well away from the bowel, which freed up the T of the uterus.  I was also prior to trying to get the tissue around, I was able to free up the strings, but a  portion of the strings were also encased in the peritoneal adhesions.  So slowly we would just  ligate the thin tissue around the IUD to free it up.  Once it was freed up, the IUD was grasped at the bottom portion of the T and it was easily removed  through the 5 mm trocar port.  There was some straw-colored peritoneal fluid in the posterior cul-de-sac that was suctioned out.  We did copious irrigation and looked at all of the sites.  There was no bleeding, nothing suggestive of any trauma to the  bowel.  We looked at the liver age and the omental adhesions and nothing was bleeding.  We then took out the 5 mm trocar.  Because of the location of the omental adhesion we could not remove it under direct visualization.  However, there was no reason to suspect that there was any issue with removal.  The CO2 gas was expelled through the  umbilical port and expiratory breaths were received.  The incisions were then reapproximated with 4-0 Monocryl and Dermabond was applied.  As Dr. Connye Burkitt was closing the incisions we were setting up to do the IUD insertion under ultrasound guidance.  The Dermabond was placed over the incisions prior to the ultrasonographer coming to the table.  The ultrasound was done under sterile technique  and the ultrasonographer also had on sterile gloves and there was a sterile cover on the ultrasound probe.  Uterus was measured to be 6 cm.  Using a uterine sound, I sounded the uterus.  I first attempted to use an os finder, which did not go through the internal os.  The metal sound was then visualized on ultrasound.  Uterus was sounded to 6.5 cm.  The IUD was easily inserted after measuring it to 6.5 cm.  The deployment of the arms was visualized on the ultrasound.  Myself, Dr. Connye Burkitt, and ultrasonographer agreed it was confirmed that there was no injury or trauma.  The IUD was deployed in the  usual fashion.  Strings were cut.  The tenaculum was removed from the cervix.  Silver nitrate was used to stop any bleeding at the tenaculum site.  Instrument, sponge and needle counts  were correct x3.  The patient tolerated the procedure well and went to the recovery room in stable condition.  CN/NUANCE  D:08/27/2019 T:08/28/2019 JOB:[id number]

## 2019-09-04 ENCOUNTER — Ambulatory Visit: Payer: BC Managed Care – PPO | Admitting: Psychology

## 2019-09-12 DIAGNOSIS — Z30431 Encounter for routine checking of intrauterine contraceptive device: Secondary | ICD-10-CM | POA: Diagnosis not present

## 2019-11-27 DIAGNOSIS — Z20822 Contact with and (suspected) exposure to covid-19: Secondary | ICD-10-CM | POA: Diagnosis not present

## 2019-11-27 DIAGNOSIS — R059 Cough, unspecified: Secondary | ICD-10-CM | POA: Diagnosis not present

## 2019-11-27 DIAGNOSIS — J209 Acute bronchitis, unspecified: Secondary | ICD-10-CM | POA: Diagnosis not present

## 2019-11-27 DIAGNOSIS — R0781 Pleurodynia: Secondary | ICD-10-CM | POA: Diagnosis not present

## 2019-12-03 ENCOUNTER — Ambulatory Visit: Payer: Self-pay | Admitting: Adult Health

## 2019-12-12 DIAGNOSIS — R0781 Pleurodynia: Secondary | ICD-10-CM | POA: Diagnosis not present

## 2019-12-12 DIAGNOSIS — M7918 Myalgia, other site: Secondary | ICD-10-CM | POA: Diagnosis not present

## 2020-01-21 ENCOUNTER — Ambulatory Visit: Payer: Self-pay | Admitting: Adult Health

## 2020-03-03 ENCOUNTER — Ambulatory Visit (INDEPENDENT_AMBULATORY_CARE_PROVIDER_SITE_OTHER): Payer: BC Managed Care – PPO | Admitting: Adult Health

## 2020-03-03 ENCOUNTER — Other Ambulatory Visit: Payer: Self-pay

## 2020-03-03 ENCOUNTER — Encounter: Payer: Self-pay | Admitting: Adult Health

## 2020-03-03 VITALS — BP 109/77 | HR 73 | Temp 98.0°F | Resp 16 | Ht 69.5 in | Wt 139.0 lb

## 2020-03-03 DIAGNOSIS — Z0000 Encounter for general adult medical examination without abnormal findings: Secondary | ICD-10-CM

## 2020-03-03 DIAGNOSIS — Z1321 Encounter for screening for nutritional disorder: Secondary | ICD-10-CM

## 2020-03-03 DIAGNOSIS — J302 Other seasonal allergic rhinitis: Secondary | ICD-10-CM

## 2020-03-03 DIAGNOSIS — Z1389 Encounter for screening for other disorder: Secondary | ICD-10-CM | POA: Diagnosis not present

## 2020-03-03 DIAGNOSIS — Z862 Personal history of diseases of the blood and blood-forming organs and certain disorders involving the immune mechanism: Secondary | ICD-10-CM

## 2020-03-03 LAB — POCT URINALYSIS DIPSTICK
Bilirubin, UA: NEGATIVE
Blood, UA: NEGATIVE
Glucose, UA: NEGATIVE
Ketones, UA: NEGATIVE
Leukocytes, UA: NEGATIVE
Nitrite, UA: NEGATIVE
Protein, UA: NEGATIVE
Spec Grav, UA: 1.01 (ref 1.010–1.025)
Urobilinogen, UA: 0.2 E.U./dL
pH, UA: 6 (ref 5.0–8.0)

## 2020-03-03 NOTE — Progress Notes (Signed)
New patient visit   Patient: [name]   DOB: 09/24/1985   35 y.o. Female  MRN: [id number] Visit Date: 03/03/2020  Today's healthcare provider: Jairo Ben, FNP   Chief Complaint  Patient presents with  . New Patient (Initial Visit)   Subjective    [name] is a 35 y.o. female who presents today as a new patient to establish care.  HPI  Patient presents in office today to establish care, she states that she feels well today and has no concerns or complaints to address. Patient reports that she follows a general diet, she is staying active by walking daily and states that sleep patterns are fairly well.   Patient reports that she goes to [employer] and reports that her last pap was within the past 3 years and was normal. She has 88 months old breastfeeding and has 34 year old.   Patient  denies any fever, body aches,chills, rash, chest pain, shortness of breath, nausea, vomiting, or diarrhea.  Denies dizziness, lightheadedness, pre syncopal or syncopal episodes.   Denies any skin changes or concerns.  She has had sinus infections since moving here almost 6 months ago, she reports she seems to be bothered by nasal allergies, not taking any medications for allergies.    Past Medical History:  Diagnosis Date  . Allergy   . Anemia   . Medical history non-contributory    Past Surgical History:  Procedure Laterality Date  . CESAREAN SECTION    . CESAREAN SECTION N/A 05/11/2019   Procedure: CESAREAN SECTION;  Surgeon: Essie Hart, MD;  Location: [facility];  Service: Obstetrics;  Laterality: N/A;  . INTRAUTERINE DEVICE (IUD) INSERTION N/A 08/27/2019   Procedure: INTRAUTERINE DEVICE (IUD) INSERTION;  Surgeon: Geryl Rankins, MD;  Location: [facility];  Service: Gynecology;  Laterality: N/A;  . IUD REMOVAL N/A 08/27/2019   Procedure: INTRAUTERINE DEVICE (IUD) REMOVAL;  Surgeon: Geryl Rankins, MD;  Location: [facility];   Service: Gynecology;  Laterality: N/A;  . LAPAROSCOPY N/A 08/27/2019   Procedure: LAPAROSCOPY DIAGNOSTIC;  Surgeon: Geryl Rankins, MD;  Location: [facility];  Service: Gynecology;  Laterality: N/A;  . OPERATIVE ULTRASOUND N/A 08/27/2019   Procedure: OPERATIVE ULTRASOUND;  Surgeon: Geryl Rankins, MD;  Location: [facility];  Service: Gynecology;  Laterality: N/A;  . WISDOM TOOTH EXTRACTION     Family Status  Relation Name Status  . Mother  Alive  . Father  Alive  . Sister  Alive  . MGF  (Not Specified)  . PGM  (Not Specified)  . PGF  (Not Specified)  . Sister  Alive  . Mat Aunt  (Not Specified)   Family History  Problem Relation Age of Onset  . Healthy Mother   . Healthy Father   . Healthy Sister   . Heart attack Maternal Grandfather   . Hyperlipidemia Maternal Grandfather   . Hypertension Maternal Grandfather   . Skin cancer Maternal Grandfather   . Pulmonary fibrosis Maternal Grandfather   . Cancer Paternal Grandmother        lung  . Alcohol abuse Paternal Grandfather   . Healthy Sister   . Skin cancer Maternal Aunt    Social History   Socioeconomic History  . Marital status: Married    Spouse name: Not on file  . Number of children: Not on file  . Years of education: Not on file  . Highest education level: Not on file  Occupational  History  . Not on file  Tobacco Use  . Smoking status: Former Smoker    Packs/day: 0.25    Years: 1.00    Pack years: 0.25    Types: Cigarettes    Quit date: 01/19/2008    Years since quitting: 12.1  . Smokeless tobacco: Never Used  Vaping Use  . Vaping Use: Never used  Substance and Sexual Activity  . Alcohol use: Yes    Alcohol/week: 1.0 standard drink    Types: 1 Glasses of wine per week  . Drug use: No  . Sexual activity: Yes    Birth control/protection: I.U.D.  Other Topics Concern  . Not on file  Social History Narrative  . Not on file   Social Determinants of Health   Financial  Resource Strain: Not on file  Food Insecurity: Not on file  Transportation Needs: Not on file  Physical Activity: Not on file  Stress: Not on file  Social Connections: Not on file   Outpatient Medications Prior to Visit  Medication Sig  . COLLAGEN PO Take by mouth.  . Iron Combinations (IRON COMPLEX PO) Take by mouth.  . levonorgestrel (MIRENA, 52 MG,) 20 MCG/24HR IUD 1 each by Intrauterine route once.  . Prenatal Vit-Fe Fumarate-FA (PRENATAL COMPLETE) 14-0.4 MG TABS Take by mouth.  . [DISCONTINUED] ibuprofen (ADVIL) 600 MG tablet Take 1 tablet (600 mg total) by mouth every 6 (six) hours as needed.  . [DISCONTINUED] oxyCODONE (OXY IR/ROXICODONE) 5 MG immediate release tablet Take 1 tablet (5 mg total) by mouth once as needed for severe pain or breakthrough pain (for pain score greater than 5).   No facility-administered medications prior to visit.   No Known Allergies  Immunization History  Administered Date(s) Administered  . HPV Bivalent 03/10/2005, 05/07/2005, 12/17/2005, 12/27/2005  . Influenza,inj,Quad PF,6+ Mos 11/05/2016, 11/04/2017  . Meningococcal Conjugate 09/11/2004    Health Maintenance  Topic Date Due  . COVID-19 Vaccine (1) 03/04/2020 (Originally 10/22/1991)  . INFLUENZA VACCINE  04/17/2020 (Originally 08/19/2019)  . TETANUS/TDAP  03/03/2021 (Originally 10/21/2005)  . Hepatitis C Screening  03/03/2021 (Originally 11/09/1985)  . PAP SMEAR-Modifier  02/28/2021  . HIV Screening  Completed    Patient Care Team: Tequisha Maahs, Eula FriedMichelle S, FNP as PCP - General (Family Medicine)  Review of Systems  Constitutional: Negative.   HENT: Positive for congestion. Negative for dental problem, drooling, ear discharge, ear pain, facial swelling, hearing loss, mouth sores, nosebleeds, postnasal drip, rhinorrhea, sinus pressure, sinus pain, sneezing, sore throat, tinnitus, trouble swallowing and voice change.        Since moving to [place] she has had some sinusitis history, not on   antihistamine or on nasal spray . Never had this before she moved here.   Eyes: Negative.   Respiratory: Negative.   Cardiovascular: Negative.   Gastrointestinal: Negative.   Endocrine: Negative.   Genitourinary: Negative.   Musculoskeletal: Negative.   Allergic/Immunologic: Negative.   Neurological: Negative.   Hematological: Negative.   Psychiatric/Behavioral: Negative.   All other systems reviewed and are negative.   Last CBC Lab Results  Component Value Date   WBC 19.3 (H) 05/12/2019   HGB 10.9 (L) 05/12/2019   HCT 33.7 (L) 05/12/2019   MCV 98.5 05/12/2019   MCH 31.9 05/12/2019   RDW 13.5 05/12/2019   PLT 155 05/12/2019   Last metabolic panel Lab Results  Component Value Date   GLUCOSE 83 02/20/2018   NA 141 02/20/2018   K 4.0 02/20/2018   CL 102 02/20/2018  CO2 22 02/20/2018   BUN 17 02/20/2018   CREATININE 0.91 02/20/2018   GFRNONAA 84 02/20/2018   GFRAA 97 02/20/2018   CALCIUM 9.0 02/20/2018   PROT 6.4 02/20/2018   ALBUMIN 4.4 02/20/2018   LABGLOB 2.0 02/20/2018   AGRATIO 2.2 02/20/2018   BILITOT 0.6 02/20/2018   ALKPHOS 71 02/20/2018   AST 29 02/20/2018   ALT 48 (H) 02/20/2018   Last lipids Lab Results  Component Value Date   CHOL 131 02/20/2018   HDL 60 02/20/2018   LDLCALC 63 02/20/2018   TRIG 39 02/20/2018   CHOLHDL 2.2 02/20/2018   Last hemoglobin A1c Lab Results  Component Value Date   HGBA1C 5.1 02/20/2018   Last thyroid functions Lab Results  Component Value Date   TSH 2.280 02/20/2018   Last vitamin D Lab Results  Component Value Date   VD25OH 31.4 11/05/2016   Last vitamin B12 and Folate No results found for: VITAMINB12, FOLATE    Objective    BP 109/77   Pulse 73   Temp 98 F (36.7 C) (Oral)   Resp 16   Ht 5' 9.5" (1.765 m)   Wt 139 lb (63 kg)   SpO2 100%   BMI 20.23 kg/m  Physical Exam Vitals and nursing note reviewed.  Constitutional:      Appearance: Normal appearance. She is not ill-appearing.  HENT:      Head: Normocephalic and atraumatic.     Comments:  Cobblestoning posterior pharynx; bilateral allergic shiners; bilateral TMs air fluid level clear; bilateral nasal turbinates mild edema erythema clear discharge;       Right Ear: External ear normal.     Left Ear: External ear normal.     Nose: Nose normal.     Mouth/Throat:     Mouth: Mucous membranes are moist.  Eyes:     General: No scleral icterus.       Right eye: No discharge.        Left eye: No discharge.     Conjunctiva/sclera: Conjunctivae normal.     Pupils: Pupils are equal, round, and reactive to light.  Cardiovascular:     Rate and Rhythm: Normal rate and regular rhythm.     Pulses: Normal pulses.     Heart sounds: Normal heart sounds. No murmur heard. No friction rub. No gallop.   Pulmonary:     Effort: Pulmonary effort is normal. No respiratory distress.     Breath sounds: Normal breath sounds. No stridor. No wheezing, rhonchi or rales.  Chest:     Chest wall: No tenderness.  Abdominal:     General: Bowel sounds are normal. There is no distension.     Palpations: Abdomen is soft. There is no mass.     Tenderness: There is no abdominal tenderness. There is no right CVA tenderness, left CVA tenderness, guarding or rebound.     Hernia: No hernia is present.  Genitourinary:    Comments: Deferred breast and gynecological exam to her gynecologist she is scheduled routinely.  Musculoskeletal:        General: No tenderness. Normal range of motion.     Cervical back: Normal range of motion and neck supple.     Right lower leg: No edema.     Left lower leg: No edema.  Skin:    General: Skin is warm.     Findings: No erythema, lesion or rash.  Neurological:     Mental Status: She is alert and oriented to person,  place, and time.     Motor: No weakness.     Gait: Gait normal.  Psychiatric:        Mood and Affect: Mood normal.        Behavior: Behavior normal.        Thought Content: Thought content normal.         Judgment: Judgment normal.     Depression Screen PHQ 2/9 Scores 03/03/2020 08/03/2018 03/16/2018 02/06/2018  PHQ - 2 Score 0 0 0 0  PHQ- 9 Score 1 1 3 1    Results for orders placed or performed in visit on 03/03/20  POCT urinalysis dipstick  Result Value Ref Range   Color, UA yellow    Clarity, UA clear    Glucose, UA Negative Negative   Bilirubin, UA negative    Ketones, UA negative    Spec Grav, UA 1.010 1.010 - 1.025   Blood, UA negative    pH, UA 6.0 5.0 - 8.0   Protein, UA Negative Negative   Urobilinogen, UA 0.2 0.2 or 1.0 E.U./dL   Nitrite, UA negative    Leukocytes, UA Negative Negative   Appearance     Odor      Assessment & Plan     1. Routine adult health maintenance The patient is advised to begin progressive daily aerobic exercise program, follow a low fat, low cholesterol diet, reduce salt in diet and cooking, reduce exposure to stress, continue current medications, continue current healthy lifestyle patterns and return for routine annual checkups. Orders Placed This Encounter  Procedures  . CBC with Differential/Platelet  . Comprehensive metabolic panel  . Lipid panel  . TSH  . Iron and TIBC  . POCT urinalysis dipstick    2. Encounter for vitamin deficiency screening Vitamin D lab.   3. History of anemia - Iron and TIBC  4. Screening for blood or protein in urine - POCT urinalysis dipstick  5. Healthcare maintenance - CBC with Differential/Platelet - Comprehensive metabolic panel - Lipid panel - TSH  6. Seasonal allergic rhinitis, unspecified trigger  Ok to trial claritin or zyrtec or equivalent antihistamine, short term use ok with breastfeeding, may decrease milk supply. Increase liquids. Would advise to start after discontinuation of breast feeding if it affects supply. This will help to know down nasal/ allergies and prevent sinusitis.  Flonase fine with breastfeeding.   Fluticasone Nasal Spray use 1 spray each nostril twice daily.    Return at anytime if any symptom worsening discussed.  7. Lactating mother Noted for care.breastfeeding 4 month old.    Red Flags discussed. The patient was given clear instructions to go to ER or return to medical center if any red flags develop, symptoms do not improve, worsen or new problems develop. They verbalized understanding.  Return in about 1 year (around 03/03/2021), or if symptoms worsen or fail to improve, for at any time for any worsening symptoms, Go to Emergency room/ urgent care if worse.    The entirety of the information documented in the History of Present Illness, Review of Systems and Physical Exam were personally obtained by me. Portions of this information were initially documented by the CMA and reviewed by me for thoroughness and accuracy.    03/05/2021, FNP  [facility] [phone] (phone) [phone] (fax)  [facility]

## 2020-03-03 NOTE — Patient Instructions (Addendum)
Ok to trial claritin or zyrtec or equivalent antihistamine, short term use ok with breastfeeding, may decrease milk supply. Increase liquids. Would advise to start after discontinuation of breast feeding if it affects supply. This will help to know down nasal/ allergies and prevent sinusitis.  Flonase fine with breastfeeding.     Fluticasone Nasal Spray use 1 spray each nostril twice daily.  What is this medicine? FLUTICASONE (floo TIK a sone) is a nasal corticosteroid. It helps decrease inflammation in your nose. This medicine is used to treat the symptoms of allergies like sneezing, itching, and runny or stuffy nose. This medicine is also used to treat nasal polyps. This medicine may be used for other purposes; ask your health care provider or pharmacist if you have questions. COMMON BRAND NAME(S): ClariSpray, Flonase, Flonase Allergy Relief, Flonase Sensimist, Veramyst, XHANCE What should I tell my health care provider before I take this medicine? They need to know if you have any of these conditions:  eye disease, vision problems  infection, like tuberculosis, herpes, or fungal infection  recent surgery on nose or sinuses  taking a corticosteroid by mouth  an unusual or allergic reaction to fluticasone, steroids, other medicines, foods, dyes, or preservatives  pregnant or trying to get pregnant  breast-feeding How should I use this medicine? This medicine is for use in the nose. Follow the directions on your prescription or product label. Do not use more often than directed. Do not share this medicine with anyone else. Make sure that you are using your nasal spray correctly. Ask you doctor or health care provider if you have any questions. This medicine comes with INSTRUCTIONS FOR USE. Ask your pharmacist for directions on how to use this medicine. Read the information carefully. Talk to your pharmacist or health care provider if you have questions. Talk to your health care  provider about the use of this medicine in children. While it may be prescribed to children as young as 2 years for selected conditions, precautions do apply. Overdosage: If you think you have taken too much of this medicine contact a poison control center or emergency room at once. NOTE: This medicine is only for you. Do not share this medicine with others. What if I miss a dose? If you miss a dose, use it as soon as you remember. If it is almost time for your next dose, use only that dose and continue with your regular schedule. Do not use double or extra doses. What may interact with this medicine?  certain antibiotics like clarithromycin and telithromycin  certain medicines for fungal infections like ketoconazole, itraconazole, and voriconazole  conivaptan  nefazodone  some medicines for HIV  vaccines This list may not describe all possible interactions. Give your health care provider a list of all the medicines, herbs, non-prescription drugs, or dietary supplements you use. Also tell them if you smoke, drink alcohol, or use illegal drugs. Some items may interact with your medicine. What should I watch for while using this medicine? Visit your healthcare professional for regular checks on your progress. Tell your healthcare professional if your symptoms do not start to get better or if they get worse. This medicine may increase your risk of getting an infection. Tell your doctor or health care professional if you are around anyone with measles or chickenpox, or if you develop sores or blisters that do not heal properly. What side effects may I notice from receiving this medicine? Side effects that you should report to your doctor or  health care professional as soon as possible:  allergic reactions like skin rash, itching or hives, swelling of the face, lips, or tongue  changes in vision  crusting or sores in the nose  nosebleed  signs and symptoms of infection like fever or  chills; cough; sore throat  white patches or sores in the mouth or nose Side effects that usually do not require medical attention (report to your doctor or health care professional if they continue or are bothersome):  burning or irritation inside the nose or throat  changes in taste or smell  cough  headache This list may not describe all possible side effects. Call your doctor for medical advice about side effects. You may report side effects to FDA at 1-800-FDA-1088. Where should I keep my medicine? Keep out of the reach of children and pets. Store at room temperature between 15 and 25 degrees C (59 and 77 degrees F). Protect from light. Get rid of any unused medicine after the expiration date. To get rid of medicines that are no longer needed or have expired:  Take the medicine to a medicine take-back program. Check with your pharmacy or law enforcement to find a location.  If you cannot return the medicine, ask your pharmacist or health care provider how to get rid of this medicine safely. NOTE: This sheet is a summary. It may not cover all possible information. If you have questions about this medicine, talk to your doctor, pharmacist, or health care provider.  2021 Elsevier/Gold Standard (2019-11-14 16:47:44)  Breast Self-Awareness Breast self-awareness is knowing how your breasts look and feel. Doing breast self-awareness is important. It allows you to catch a breast problem early while it is still small and can be treated. All women should do breast self-awareness, including women who have had breast implants. Tell your doctor if you notice a change in your breasts. What you need:  A mirror.  A well-lit room. How to do a breast self-exam A breast self-exam is one way to learn what is normal for your breasts and to check for changes. To do a breast self-exam: Look for changes 1. Take off all the clothes above your waist. 2. Stand in front of a mirror in a room with good  lighting. 3. Put your hands on your hips. 4. Push your hands down. 5. Look at your breasts and nipples in the mirror to see if one breast or nipple looks different from the other. Check to see if: ? The shape of one breast is different. ? The size of one breast is different. ? There are wrinkles, dips, and bumps in one breast and not the other. 6. Look at each breast for changes in the skin, such as: ? Redness. ? Scaly areas. 7. Look for changes in your nipples, such as: ? Liquid around the nipples. ? Bleeding. ? Dimpling. ? Redness. ? A change in where the nipples are.   Feel for changes 1. Lie on your back on the floor. 2. Feel each breast. To do this, follow these steps: ? Pick a breast to feel. ? Put the arm closest to that breast above your head. ? Use your other arm to feel the nipple area of your breast. Feel the area with the pads of your three middle fingers by making small circles with your fingers. For the first circle, press lightly. For the second circle, press harder. For the third circle, press even harder. ? Keep making circles with your fingers at the  different pressures as you move down your breast. Stop when you feel your ribs. ? Move your fingers a little toward the center of your body. ? Start making circles with your fingers again, this time going up until you reach your collarbone. ? Keep making up-and-down circles until you reach your armpit. Remember to keep using the three pressures. ? Feel the other breast in the same way. 3. Sit or stand in the tub or shower. 4. With soapy water on your skin, feel each breast the same way you did in step 2 when you were lying on the floor.   Write down what you find Writing down what you find can help you remember what to tell your doctor. Write down:  What is normal for each breast.  Any changes you find in each breast, including: ? The kind of changes you find. ? Whether you have pain. ? Size and location of any  lumps.  When you last had your menstrual period. General tips  Check your breasts every month.  If you are breastfeeding, the best time to check your breasts is after you feed your baby or after you use a breast pump.  If you get menstrual periods, the best time to check your breasts is 5-7 days after your menstrual period is over.  With time, you will become comfortable with the self-exam, and you will begin to know if there are changes in your breasts. Contact a doctor if you:  See a change in the shape or size of your breasts or nipples.  See a change in the skin of your breast or nipples, such as red or scaly skin.  Have fluid coming from your nipples that is not normal.  Find a lump or thick area that was not there before.  Have pain in your breasts.  Have any concerns about your breast health. Summary  Breast self-awareness includes looking for changes in your breasts, as well as feeling for changes within your breasts.  Breast self-awareness should be done in front of a mirror in a well-lit room.  You should check your breasts every month. If you get menstrual periods, the best time to check your breasts is 5-7 days after your menstrual period is over.  Let your doctor know of any changes you see in your breasts, including changes in size, changes on the skin, pain or tenderness, or fluid from your nipples that is not normal. This information is not intended to replace advice given to you by your health care provider. Make sure you discuss any questions you have with your health care provider. Document Revised: 08/23/2017 Document Reviewed: 08/23/2017 Elsevier Patient Education  2021 Else[employer] Inc. Vitamin D Deficiency Vitamin D deficiency is when your body does not have enough vitamin D. Vitamin D is important to your body because:  It helps your body use other minerals.  It helps to keep your bones strong and healthy.  It may help to prevent some diseases.  It  helps your heart and other muscles work well. Not getting enough vitamin D can make your bones soft. It can also cause other health problems. What are the causes? This condition may be caused by:  Not eating enough foods that contain vitamin D.  Not getting enough sun.  Having diseases that make it hard for your body to absorb vitamin D.  Having a surgery in which a part of the stomach or a part of the small intestine is removed.  Having kidney  disease or liver disease. What increases the risk? You are more likely to get this condition if:  You are older.  You do not spend much time outdoors.  You live in a nursing home.  You have had broken bones.  You have weak or thin bones (osteoporosis).  You have a disease or condition that changes how your body absorbs vitamin D.  You have dark skin.  You take certain medicines.  You are overweight or obese. What are the signs or symptoms?  In mild cases, there may not be any symptoms. If the condition is very bad, symptoms may include: ? Bone pain. ? Muscle pain. ? Falling often. ? Broken bones caused by a minor injury. How is this treated? Treatment may include taking supplements as told by your doctor. Your doctor will tell you what dose is best for you. Supplements may include:  Vitamin D.  Calcium. Follow these instructions at home: Eating and drinking  Eat foods that contain vitamin D, such as: ? Dairy products, cereals, or juices with added vitamin D. Check the label. ? Fish, such as salmon or trout. ? Eggs. ? Oysters. ? Mushrooms. The items listed above may not be a complete list of what you can eat and drink. Contact a dietitian for more options.   General instructions  Take medicines and supplements only as told by your doctor.  Get regular, safe exposure to natural sunlight.  Do not use a tanning bed.  Maintain a healthy weight. Lose weight if needed.  Keep all follow-up visits as told by your  doctor. This is important. How is this prevented?  You can get vitamin D by: ? Eating foods that naturally contain vitamin D. ? Eating or drinking products that have vitamin D added to them, such as cereals, juices, and milk. ? Taking vitamin D or a multivitamin that contains vitamin D. ? Being in the sun. Your body makes vitamin D when your skin is exposed to sunlight. Your body changes the sunlight into a form of the vitamin that it can use. Contact a doctor if:  Your symptoms do not go away.  You feel sick to your stomach (nauseous).  You throw up (vomit).  You poop less often than normal, or you have trouble pooping (constipation). Summary  Vitamin D deficiency is when your body does not have enough vitamin D.  Vitamin D helps to keep your bones strong and healthy.  This condition is often treated by taking a supplement.  Your doctor will tell you what dose is best for you. This information is not intended to replace advice given to you by your health care provider. Make sure you discuss any questions you have with your health care provider. Document Revised: 09/12/2017 Document Reviewed: 09/12/2017 Elsevier Patient Education  2021 Elsevier Inc. Health Maintenance, Female Adopting a healthy lifestyle and getting preventive care are important in promoting health and wellness. Ask your health care provider about:  The right schedule for you to have regular tests and exams.  Things you can do on your own to prevent diseases and keep yourself healthy. What should I know about diet, weight, and exercise? Eat a healthy diet  Eat a diet that includes plenty of vegetables, fruits, low-fat dairy products, and lean protein.  Do not eat a lot of foods that are high in solid fats, added sugars, or sodium.   Maintain a healthy weight Body mass index (BMI) is used to identify weight problems. It estimates body fat  based on height and weight. Your health care provider can help  determine your BMI and help you achieve or maintain a healthy weight. Get regular exercise Get regular exercise. This is one of the most important things you can do for your health. Most adults should:  Exercise for at least 150 minutes each week. The exercise should increase your heart rate and make you sweat (moderate-intensity exercise).  Do strengthening exercises at least twice a week. This is in addition to the moderate-intensity exercise.  Spend less time sitting. Even light physical activity can be beneficial. Watch cholesterol and blood lipids Have your blood tested for lipids and cholesterol at 35 years of age, then have this test every 5 years. Have your cholesterol levels checked more often if:  Your lipid or cholesterol levels are high.  You are older than 34 years of age.  You are at high risk for heart disease. What should I know about cancer screening? Depending on your health history and family history, you may need to have cancer screening at various ages. This may include screening for:  Breast cancer.  Cervical cancer.  Colorectal cancer.  Skin cancer.  Lung cancer. What should I know about heart disease, diabetes, and high blood pressure? Blood pressure and heart disease  High blood pressure causes heart disease and increases the risk of stroke. This is more likely to develop in people who have high blood pressure readings, are of African descent, or are overweight.  Have your blood pressure checked: ? Every 3-5 years if you are 59-33 years of age. ? Every year if you are 63 years old or older. Diabetes Have regular diabetes screenings. This checks your fasting blood sugar level. Have the screening done:  Once every three years after age 36 if you are at a normal weight and have a low risk for diabetes.  More often and at a younger age if you are overweight or have a high risk for diabetes. What should I know about preventing infection? Hepatitis  B If you have a higher risk for hepatitis B, you should be screened for this virus. Talk with your health care provider to find out if you are at risk for hepatitis B infection. Hepatitis C Testing is recommended for:  Everyone born from [birth year] through 1964.  Anyone with known risk factors for hepatitis C. Sexually transmitted infections (STIs)  Get screened for STIs, including gonorrhea and chlamydia, if: ? You are sexually active and are younger than 34 years of age. ? You are older than 34 years of age and your health care provider tells you that you are at risk for this type of infection. ? Your sexual activity has changed since you were last screened, and you are at increased risk for chlamydia or gonorrhea. Ask your health care provider if you are at risk.  Ask your health care provider about whether you are at high risk for HIV. Your health care provider may recommend a prescription medicine to help prevent HIV infection. If you choose to take medicine to prevent HIV, you should first get tested for HIV. You should then be tested every 3 months for as long as you are taking the medicine. Pregnancy  If you are about to stop having your period (premenopausal) and you may become pregnant, seek counseling before you get pregnant.  Take 400 to 800 micrograms (mcg) of folic acid every day if you become pregnant.  Ask for birth control (contraception) if you want to prevent  pregnancy. Osteoporosis and menopause Osteoporosis is a disease in which the bones lose minerals and strength with aging. This can result in bone fractures. If you are 9 years old or older, or if you are at risk for osteoporosis and fractures, ask your health care provider if you should:  Be screened for bone loss.  Take a calcium or vitamin D supplement to lower your risk of fractures.  Be given hormone replacement therapy (HRT) to treat symptoms of menopause. Follow these instructions at home: Lifestyle  Do not  use any products that contain nicotine or tobacco, such as cigarettes, e-cigarettes, and chewing tobacco. If you need help quitting, ask your health care provider.  Do not use street drugs.  Do not share needles.  Ask your health care provider for help if you need support or information about quitting drugs. Alcohol use  Do not drink alcohol if: ? Your health care provider tells you not to drink. ? You are pregnant, may be pregnant, or are planning to become pregnant.  If you drink alcohol: ? Limit how much you use to 0-1 drink a day. ? Limit intake if you are breastfeeding.  Be aware of how much alcohol is in your drink. In the [place] one drink equals one 12 oz bottle of beer (355 mL), one 5 oz glass of wine (148 mL), or one 1 oz glass of hard liquor (44 mL). General instructions  Schedule regular health, dental, and eye exams.  Stay current with your vaccines.  Tell your health care provider if: ? You often feel depressed. ? You have ever been abused or do not feel safe at home. Summary  Adopting a healthy lifestyle and getting preventive care are important in promoting health and wellness.  Follow your health care provider's instructions about healthy diet, exercising, and getting tested or screened for diseases.  Follow your health care provider's instructions on monitoring your cholesterol and blood pressure. This information is not intended to replace advice given to you by your health care provider. Make sure you discuss any questions you have with your health care provider. Document Revised: 12/28/2017 Document Reviewed: 12/28/2017 Elsevier Patient Education  2021 [employer].

## 2020-03-04 LAB — CBC WITH DIFFERENTIAL/PLATELET
Basophils Absolute: 0 10*3/uL (ref 0.0–0.2)
Basos: 1 %
EOS (ABSOLUTE): 0.2 10*3/uL (ref 0.0–0.4)
Eos: 3 %
Hematocrit: 40.9 % (ref 34.0–46.6)
Hemoglobin: 13.4 g/dL (ref 11.1–15.9)
Immature Grans (Abs): 0 10*3/uL (ref 0.0–0.1)
Immature Granulocytes: 0 %
Lymphocytes Absolute: 2.2 10*3/uL (ref 0.7–3.1)
Lymphs: 35 %
MCH: 30.2 pg (ref 26.6–33.0)
MCHC: 32.8 g/dL (ref 31.5–35.7)
MCV: 92 fL (ref 79–97)
Monocytes Absolute: 0.3 10*3/uL (ref 0.1–0.9)
Monocytes: 5 %
Neutrophils Absolute: 3.7 10*3/uL (ref 1.4–7.0)
Neutrophils: 56 %
Platelets: 199 10*3/uL (ref 150–450)
RBC: 4.43 x10E6/uL (ref 3.77–5.28)
RDW: 12.9 % (ref 11.7–15.4)
WBC: 6.4 10*3/uL (ref 3.4–10.8)

## 2020-03-04 LAB — LIPID PANEL
Chol/HDL Ratio: 2.2 ratio (ref 0.0–4.4)
Cholesterol, Total: 154 mg/dL (ref 100–199)
HDL: 70 mg/dL (ref >39)
LDL Chol Calc (NIH): 74 mg/dL (ref 0–99)
Triglycerides: 48 mg/dL (ref 0–149)
VLDL Cholesterol Cal: 10 mg/dL (ref 5–40)

## 2020-03-04 LAB — TSH: TSH: 1.49 u[IU]/mL (ref 0.450–4.500)

## 2020-03-04 LAB — IRON AND TIBC
Iron Saturation: 41 % (ref 15–55)
Iron: 123 ug/dL (ref 27–159)
Total Iron Binding Capacity: 299 ug/dL (ref 250–450)
UIBC: 176 ug/dL (ref 131–425)

## 2020-03-04 LAB — COMPREHENSIVE METABOLIC PANEL
ALT: 24 IU/L (ref 0–32)
AST: 20 IU/L (ref 0–40)
Albumin/Globulin Ratio: 2.6 — ABNORMAL HIGH (ref 1.2–2.2)
Albumin: 5 g/dL — ABNORMAL HIGH (ref 3.8–4.8)
Alkaline Phosphatase: 96 IU/L (ref 44–121)
BUN/Creatinine Ratio: 17 (ref 9–23)
BUN: 15 mg/dL (ref 6–20)
Bilirubin Total: 0.8 mg/dL (ref 0.0–1.2)
CO2: 23 mmol/L (ref 20–29)
Calcium: 9.7 mg/dL (ref 8.7–10.2)
Chloride: 101 mmol/L (ref 96–106)
Creatinine, Ser: 0.87 mg/dL (ref 0.57–1.00)
GFR calc Af Amer: 101 mL/min/{1.73_m2} (ref >59)
GFR calc non Af Amer: 88 mL/min/{1.73_m2} (ref >59)
Globulin, Total: 1.9 g/dL (ref 1.5–4.5)
Glucose: 93 mg/dL (ref 65–99)
Potassium: 4.2 mmol/L (ref 3.5–5.2)
Sodium: 139 mmol/L (ref 134–144)
Total Protein: 6.9 g/dL (ref 6.0–8.5)

## 2020-03-04 NOTE — Progress Notes (Signed)
Labs are within normal limits. She is lactating this can cause albumin levels to be higher. Can recheck CMP in 6 months.

## 2020-09-08 DIAGNOSIS — Z01419 Encounter for gynecological examination (general) (routine) without abnormal findings: Secondary | ICD-10-CM | POA: Diagnosis not present

## 2020-09-25 IMAGING — US US BREAST*R* LIMITED INC AXILLA
1 series · 8 of 8 positions shown · non-contrast
Comparison: None

CLINICAL DATA: 33-year-old pregnant patient with possible mass is
palpated on clinical physical examination between the 9 and 12
o'clock position on the right breast and the 12 to 3 o'clock
position of the left breast.

The patient states that she does not palpate a specific lump in
either breast.
EXAM:
ULTRASOUND OF THE BILATERAL BREAST

[Series 1: us breast*right* limited inc axilla · 0.06mm/px · 8 of 8 slices shown]
[im 1/8]
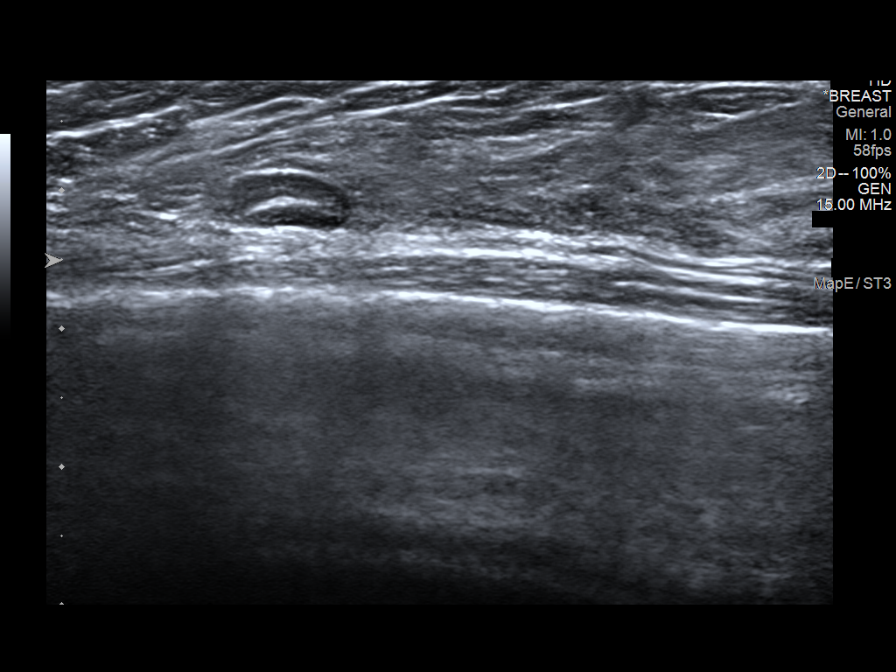
[im 2/8]
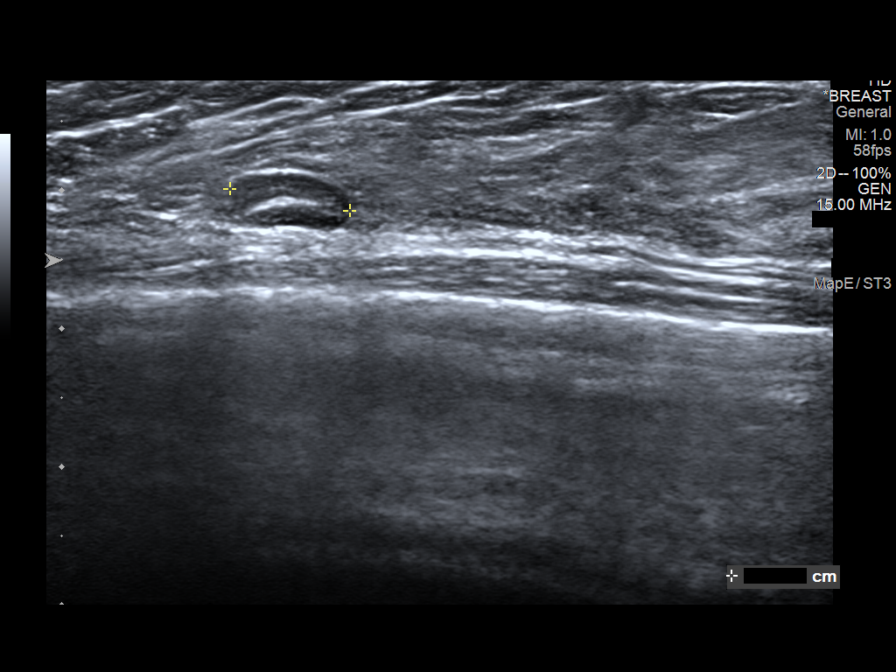
[im 3/8]
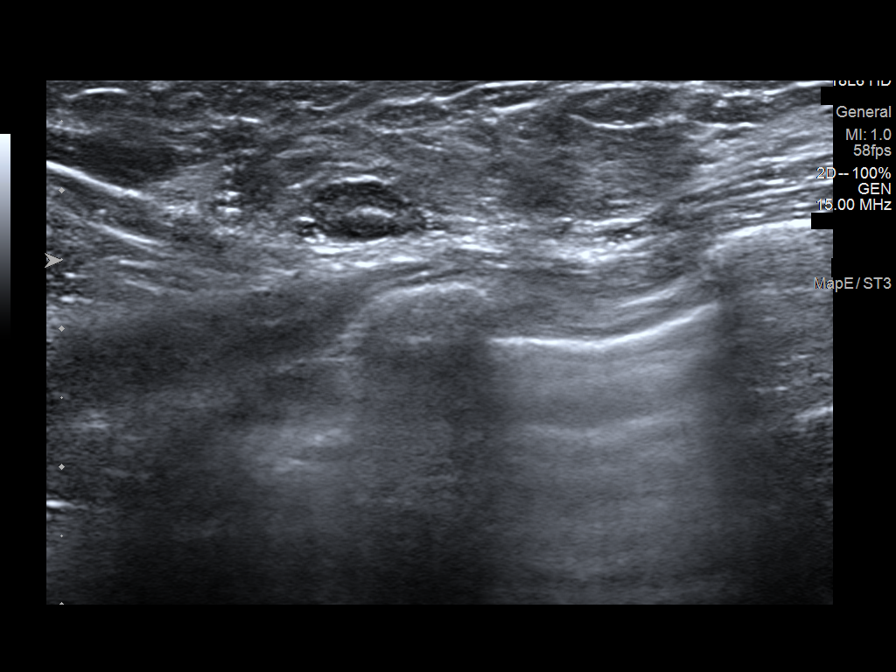
[im 4/8]
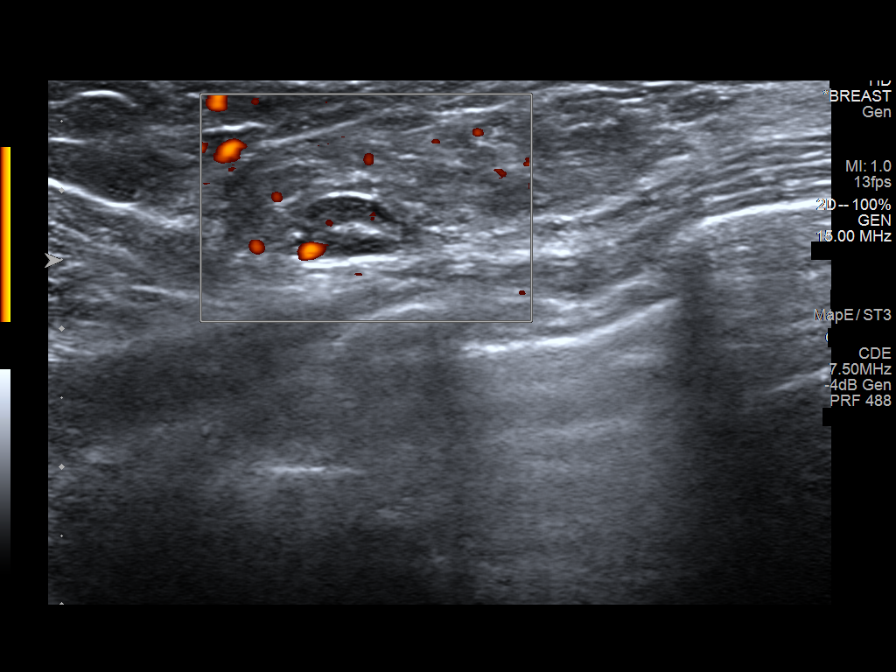
[im 5/8]
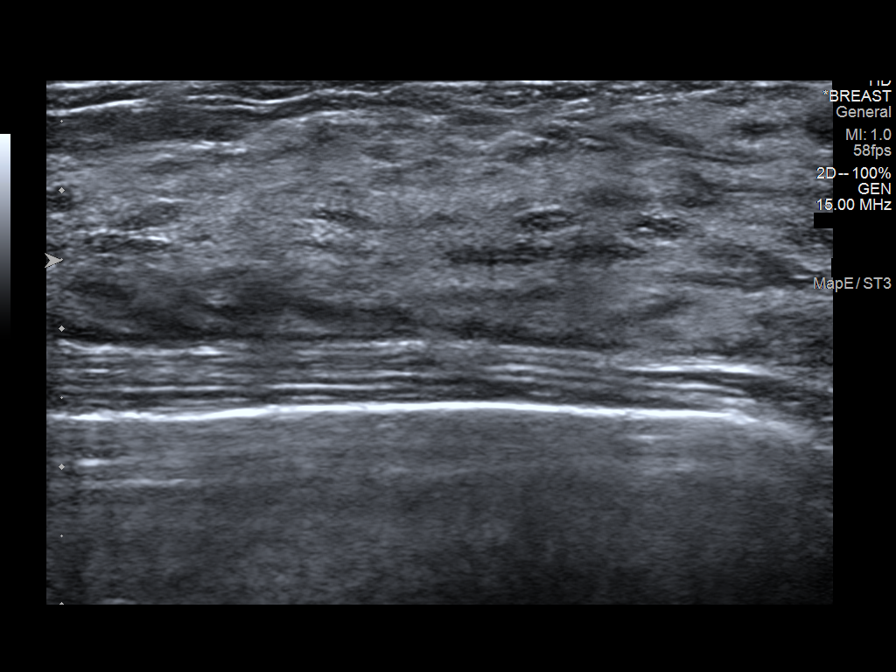
[im 6/8]
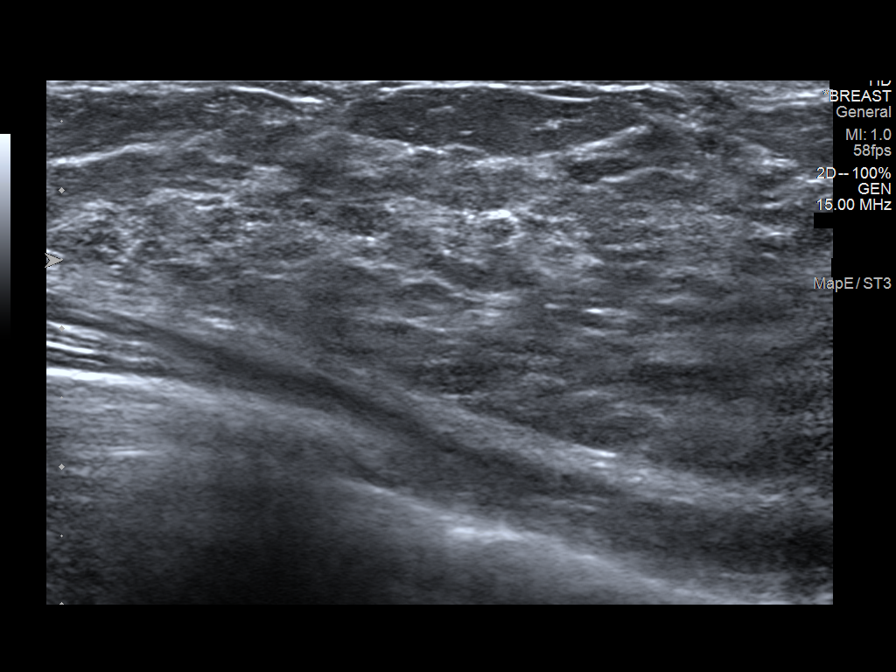
[im 7/8]
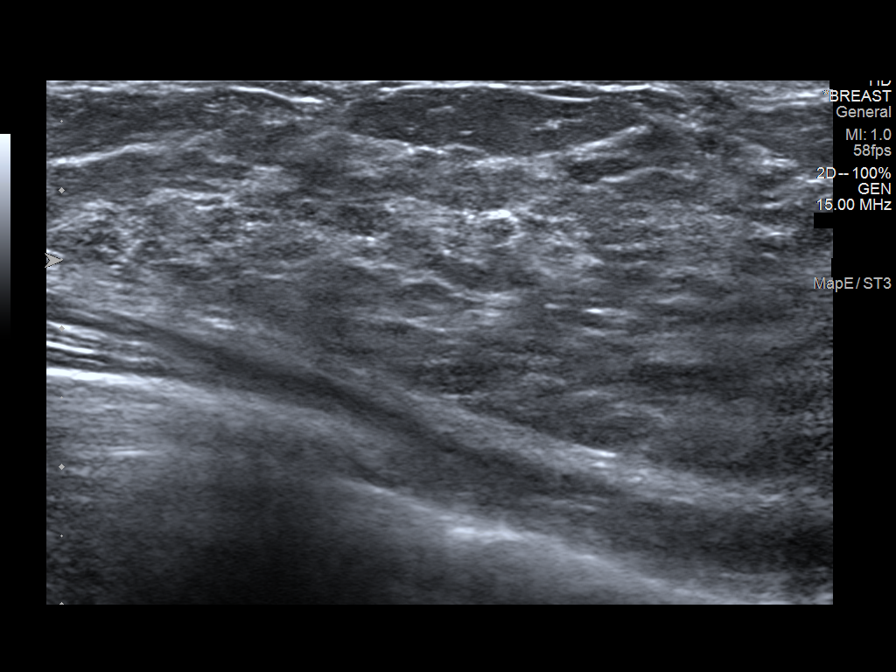
[im 8/8]
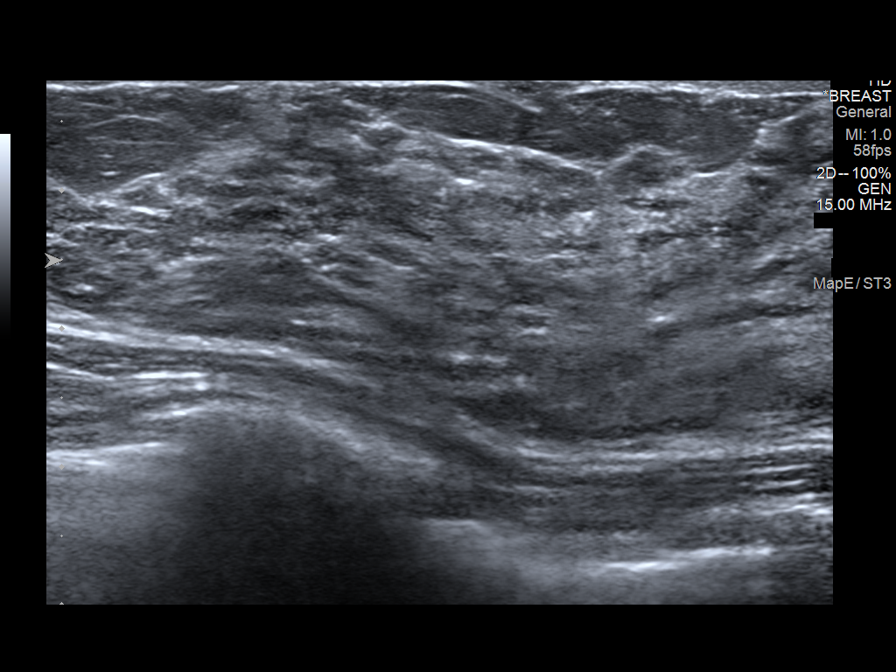

[8 of 8 positions shown; findings below may reference images not displayed]

FINDINGS: Targeted ultrasound is performed, showing normal dense
fibroglandular tissue bilaterally in the upper outer quadrants of
both breast. Incidental note is made of a benign intramammary lymph
node in the 10 o'clock position of the right breast. No solid or
cystic mass or abnormal shadowing is identified in either breast to
suggest malignancy.
IMPRESSION: Normal ultrasound of the upper-outer quadrants of both breasts. No
suspicious findings.

RECOMMENDATION:
Screening mammogram at age 40 unless there are persistent or
intervening clinical concerns. (Code:[id number])

I have discussed the findings and recommendations with the patient.
If applicable, a reminder letter will be sent to the patient
regarding the next appointment.

BI-RADS CATEGORY  1: Negative.

## 2020-09-25 IMAGING — US US BREAST*L* LIMITED INC AXILLA
1 series · 6 of 6 positions shown · non-contrast
Comparison: None

CLINICAL DATA: 33-year-old pregnant patient with possible mass is
palpated on clinical physical examination between the 9 and 12
o'clock position on the right breast and the 12 to 3 o'clock
position of the left breast.

The patient states that she does not palpate a specific lump in
either breast.
EXAM:
ULTRASOUND OF THE BILATERAL BREAST

[Series 1: us breast*left* limited inc axilla · 0.07mm/px · 6 of 6 slices shown]
[im 1/6]
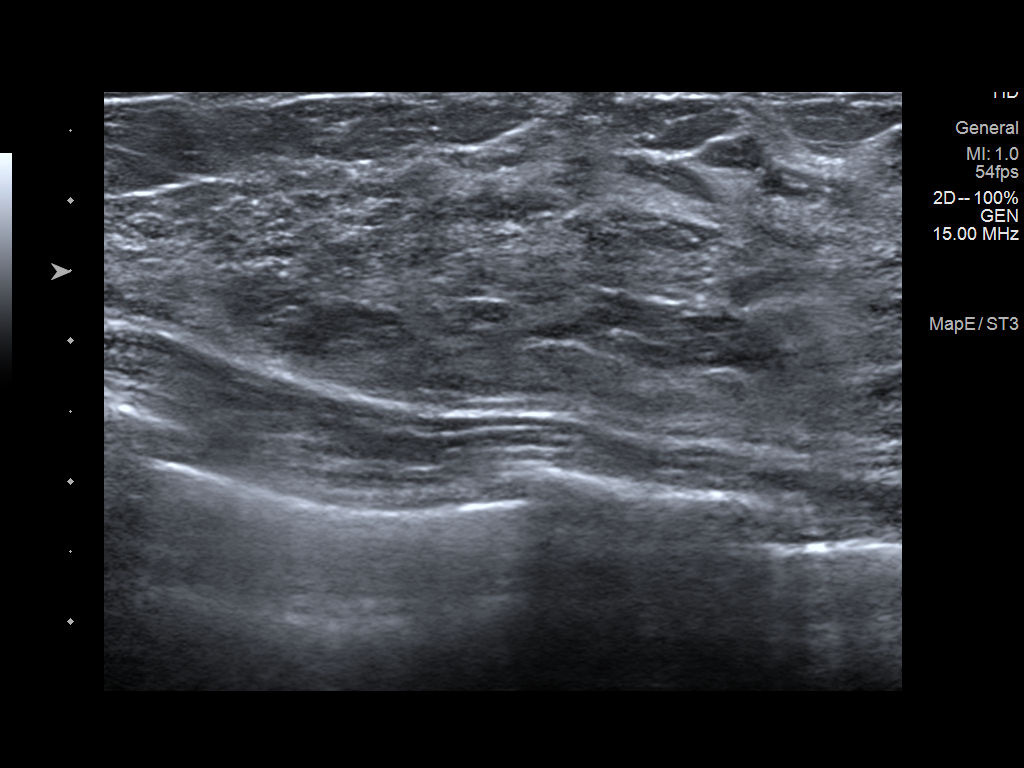
[im 2/6]
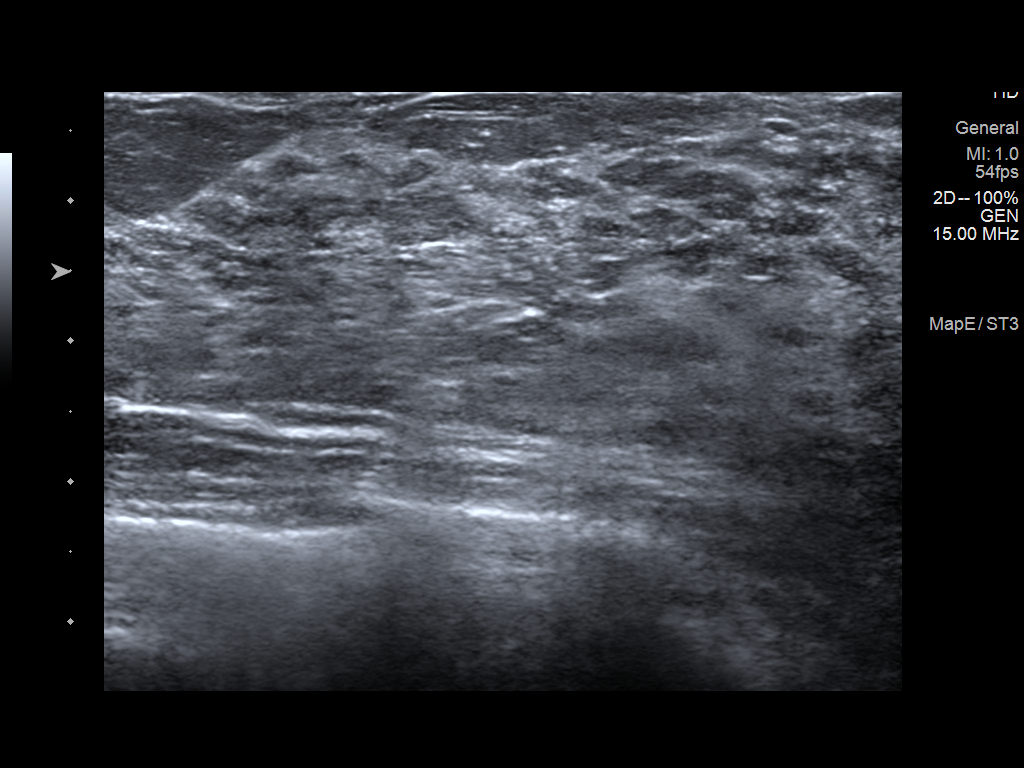
[im 3/6]
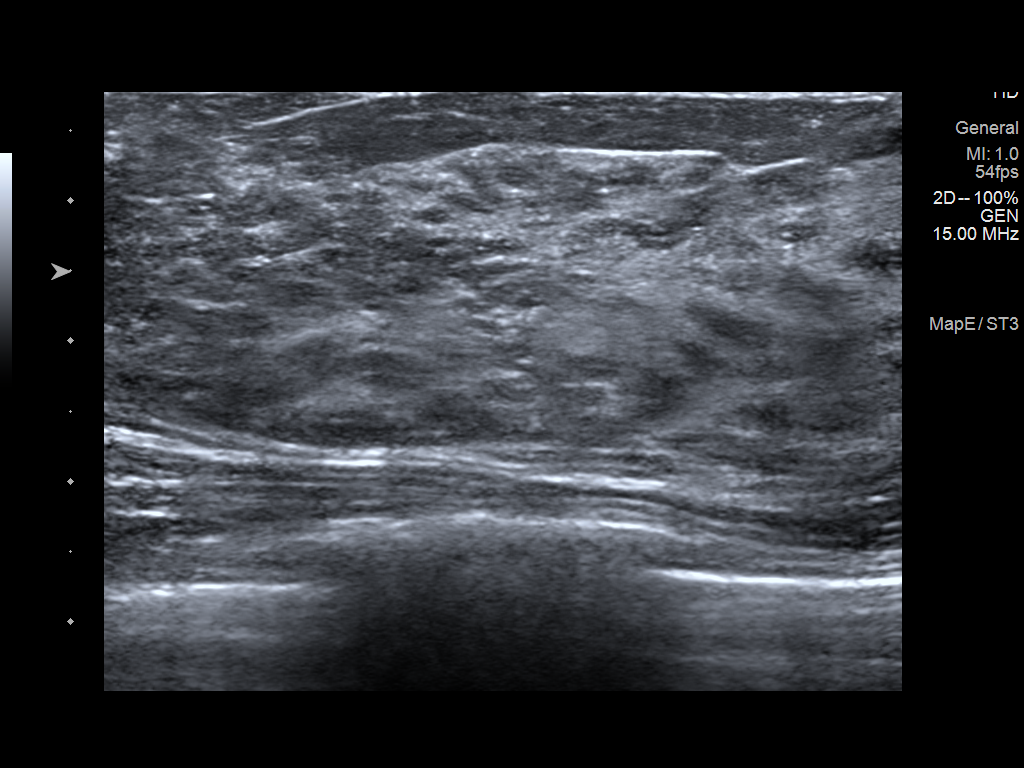
[im 4/6]
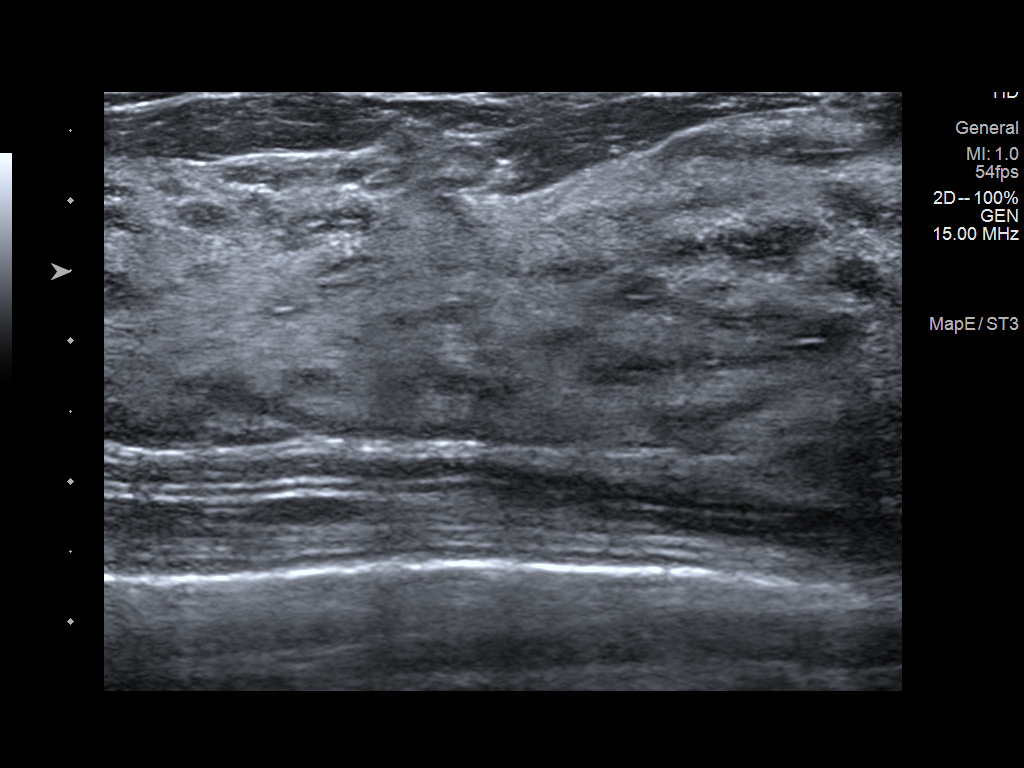
[im 5/6]
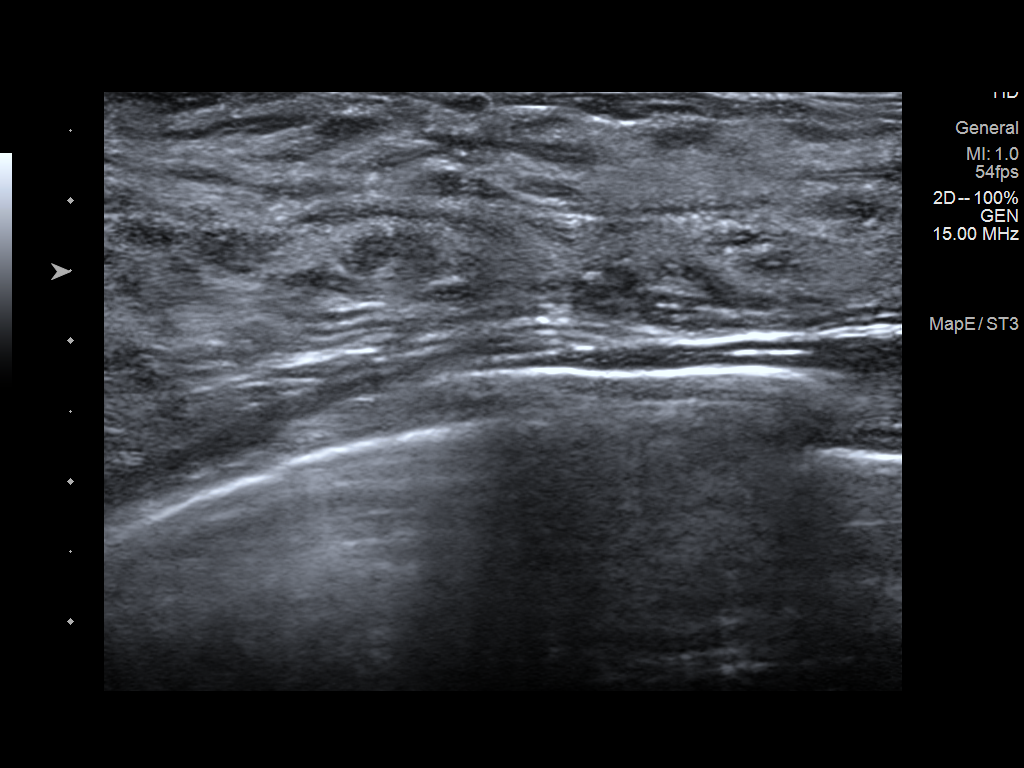
[im 6/6]
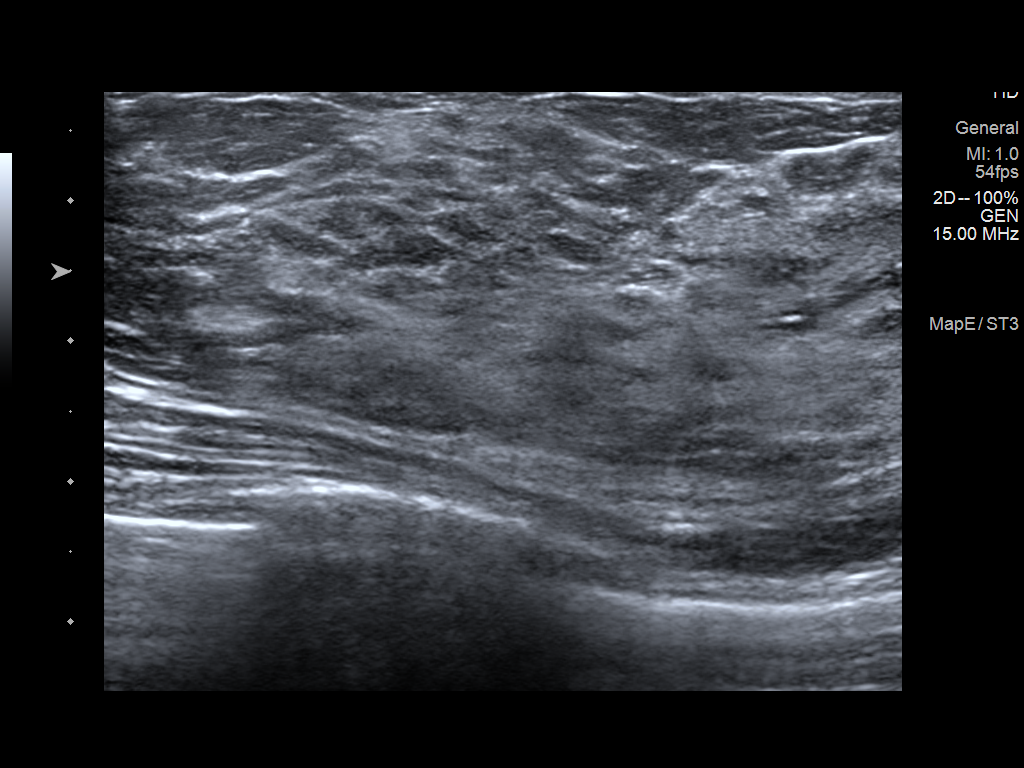

[6 of 6 positions shown; findings below may reference images not displayed]

FINDINGS: Targeted ultrasound is performed, showing normal dense
fibroglandular tissue bilaterally in the upper outer quadrants of
both breast. Incidental note is made of a benign intramammary lymph
node in the 10 o'clock position of the right breast. No solid or
cystic mass or abnormal shadowing is identified in either breast to
suggest malignancy.
IMPRESSION: Normal ultrasound of the upper-outer quadrants of both breasts. No
suspicious findings.

RECOMMENDATION:
Screening mammogram at age 40 unless there are persistent or
intervening clinical concerns. (Code:[id number])

I have discussed the findings and recommendations with the patient.
If applicable, a reminder letter will be sent to the patient
regarding the next appointment.

BI-RADS CATEGORY  1: Negative.

## 2021-03-05 DIAGNOSIS — Z30431 Encounter for routine checking of intrauterine contraceptive device: Secondary | ICD-10-CM | POA: Diagnosis not present

## 2021-05-25 DIAGNOSIS — J029 Acute pharyngitis, unspecified: Secondary | ICD-10-CM | POA: Diagnosis not present

## 2021-06-20 IMAGING — DX DG ABDOMEN 1V
1 series · 1 of 1 positions shown · non-contrast
Comparison: None.

CLINICAL DATA: Evaluate for IUD

EXAM:
ABDOMEN - 1 VIEW

[dg abd 1 view]
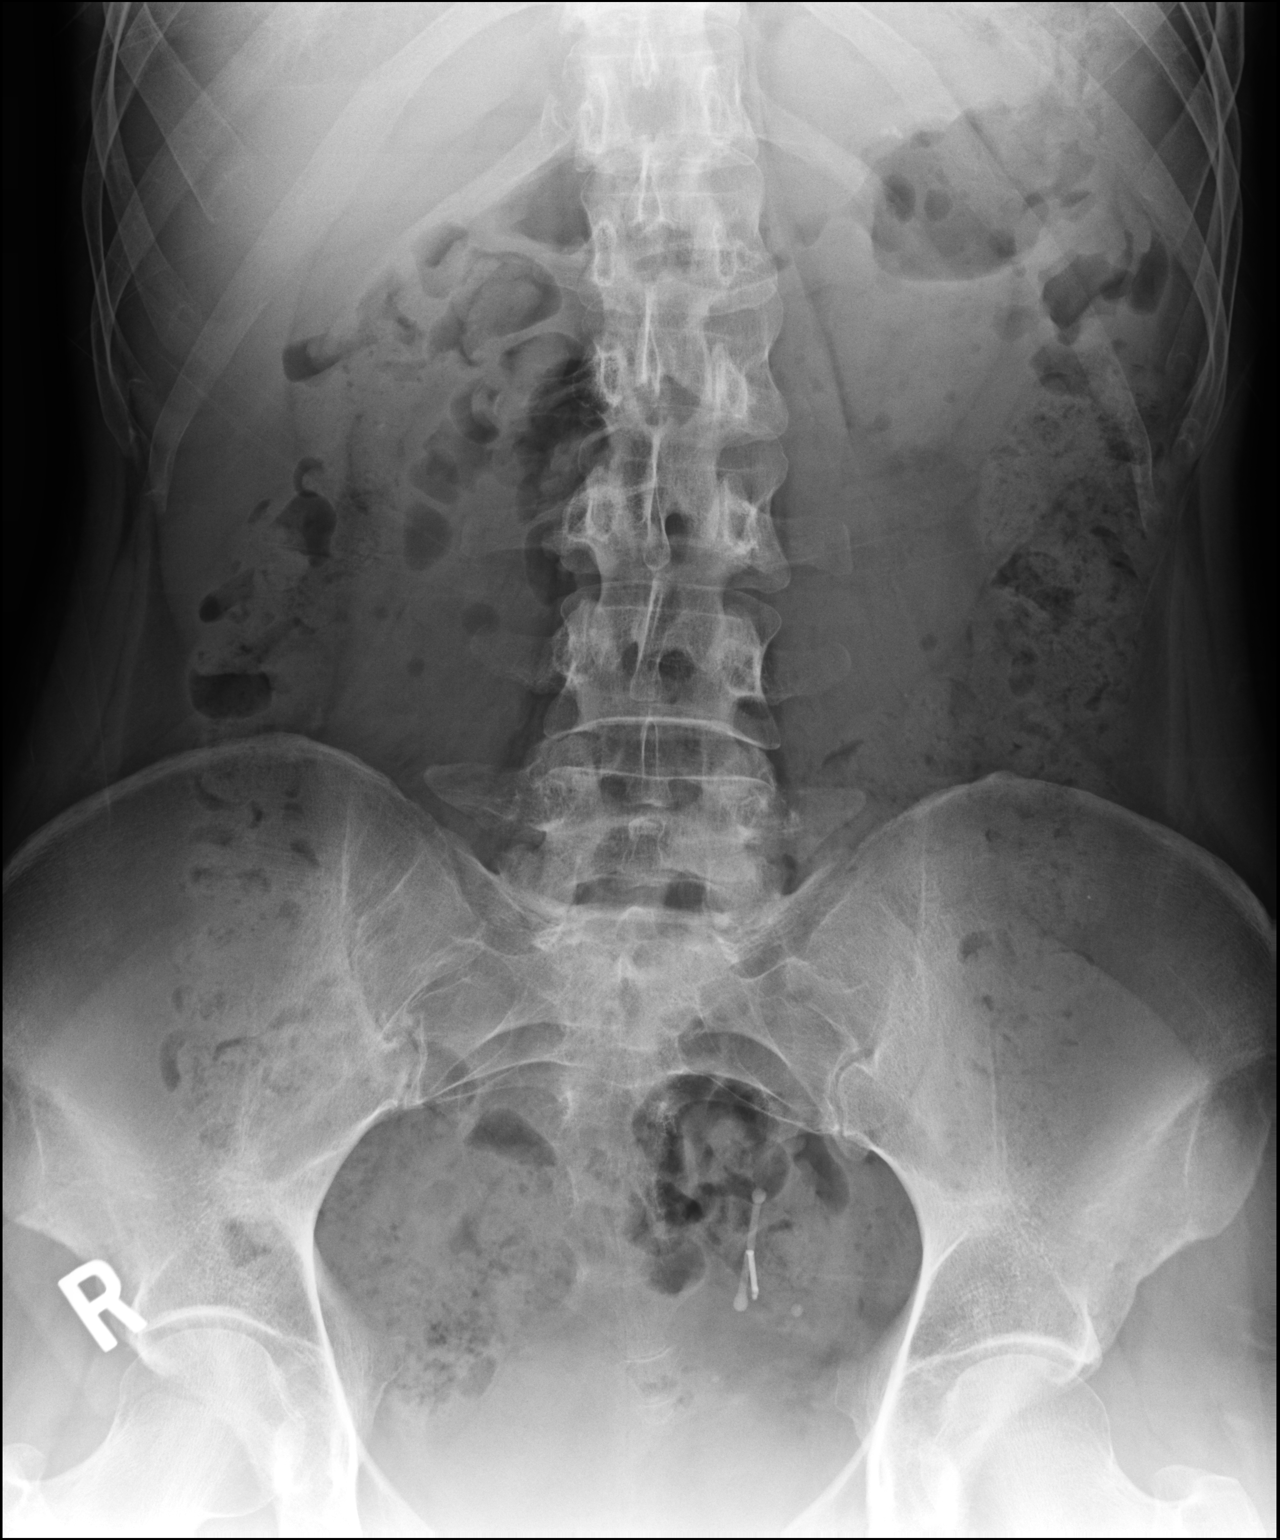

[1 of 1 positions shown; findings below may reference images not displayed]

FINDINGS: Scattered large and small bowel gas is noted. Mild retained fecal
material is seen. An IUD is identified in the left hemipelvis
somewhat deviated from the expected position in the midline although
it may still be within the uterus. No bony abnormality is noted.
IMPRESSION: Mild changes of constipation.

IUD in the left hemipelvis although may still be within the uterus.
Ultrasound or CT evaluation can be performed as clinically necessary
for further delineation.

## 2021-08-11 DIAGNOSIS — M9902 Segmental and somatic dysfunction of thoracic region: Secondary | ICD-10-CM | POA: Diagnosis not present

## 2021-08-11 DIAGNOSIS — G4489 Other headache syndrome: Secondary | ICD-10-CM | POA: Diagnosis not present

## 2021-08-11 DIAGNOSIS — M9901 Segmental and somatic dysfunction of cervical region: Secondary | ICD-10-CM | POA: Diagnosis not present

## 2021-08-11 DIAGNOSIS — M9903 Segmental and somatic dysfunction of lumbar region: Secondary | ICD-10-CM | POA: Diagnosis not present

## 2021-08-17 ENCOUNTER — Encounter: Payer: Self-pay | Admitting: Physician Assistant

## 2021-08-17 ENCOUNTER — Ambulatory Visit (INDEPENDENT_AMBULATORY_CARE_PROVIDER_SITE_OTHER): Payer: BC Managed Care – PPO | Admitting: Physician Assistant

## 2021-08-17 VITALS — BP 100/73 | HR 81 | Ht 70.0 in | Wt 141.1 lb

## 2021-08-17 DIAGNOSIS — M9902 Segmental and somatic dysfunction of thoracic region: Secondary | ICD-10-CM | POA: Diagnosis not present

## 2021-08-17 DIAGNOSIS — Z0000 Encounter for general adult medical examination without abnormal findings: Secondary | ICD-10-CM

## 2021-08-17 DIAGNOSIS — M9903 Segmental and somatic dysfunction of lumbar region: Secondary | ICD-10-CM | POA: Diagnosis not present

## 2021-08-17 DIAGNOSIS — M9901 Segmental and somatic dysfunction of cervical region: Secondary | ICD-10-CM | POA: Diagnosis not present

## 2021-08-17 DIAGNOSIS — G4489 Other headache syndrome: Secondary | ICD-10-CM | POA: Diagnosis not present

## 2021-08-17 NOTE — Progress Notes (Signed)
I,Sha'taria Tyson,acting as a Neurosurgeon for [employer], PA-C.,have documented all relevant documentation on the behalf of Melissa Ferguson, PA-C,as directed by  Melissa Ferguson, PA-C while in the presence of Melissa Ferguson, PA-C.   Complete physical exam   Patient: [name]   DOB: Oct 03, 1985   36 y.o. Female  MRN: [id number] Visit Date: 08/17/2021  Today's healthcare provider: Alfredia Ferguson, PA-C   Cc. cpe  Subjective    [name] is a 36 y.o. female who presents today for a complete physical exam.  She reports consuming a general diet. The patient has a physically strenuous job, but has no regular exercise apart from work.  She generally feels fairly well. She reports sleeping fairly well. She does not have additional problems to discuss today.  HPI  Pap Smear scheduled for August w/ gyn.   Past Medical History:  Diagnosis Date   Allergy    Anemia    Medical history non-contributory    Past Surgical History:  Procedure Laterality Date   CESAREAN SECTION     CESAREAN SECTION N/A 05/11/2019   Procedure: CESAREAN SECTION;  Surgeon: Essie Hart, MD;  Location: [facility];  Service: Obstetrics;  Laterality: N/A;   INTRAUTERINE DEVICE (IUD) INSERTION N/A 08/27/2019   Procedure: INTRAUTERINE DEVICE (IUD) INSERTION;  Surgeon: Geryl Rankins, MD;  Location: [facility];  Service: Gynecology;  Laterality: N/A;   IUD REMOVAL N/A 08/27/2019   Procedure: INTRAUTERINE DEVICE (IUD) REMOVAL;  Surgeon: Geryl Rankins, MD;  Location: [facility];  Service: Gynecology;  Laterality: N/A;   LAPAROSCOPY N/A 08/27/2019   Procedure: LAPAROSCOPY DIAGNOSTIC;  Surgeon: Geryl Rankins, MD;  Location: [facility];  Service: Gynecology;  Laterality: N/A;   OPERATIVE ULTRASOUND N/A 08/27/2019   Procedure: OPERATIVE ULTRASOUND;  Surgeon: Geryl Rankins, MD;  Location: [facility];  Service: Gynecology;  Laterality: N/A;   WISDOM TOOTH  EXTRACTION     Social History   Socioeconomic History   Marital status: Married    Spouse name: Not on file   Number of children: Not on file   Years of education: Not on file   Highest education level: Not on file  Occupational History   Not on file  Tobacco Use   Smoking status: Former    Packs/day: 0.25    Years: 1.00    Total pack years: 0.25    Types: Cigarettes    Quit date: 01/19/2008    Years since quitting: 13.5   Smokeless tobacco: Never  Vaping Use   Vaping Use: Never used  Substance and Sexual Activity   Alcohol use: Yes    Alcohol/week: 1.0 standard drink of alcohol    Types: 1 Glasses of wine per week   Drug use: No   Sexual activity: Yes    Birth control/protection: I.U.D.  Other Topics Concern   Not on file  Social History Narrative   Not on file   Social Determinants of Health   Financial Resource Strain: Not on file  Food Insecurity: Not on file  Transportation Needs: Not on file  Physical Activity: Not on file  Stress: Not on file  Social Connections: Not on file  Intimate Partner Violence: Not on file   Family Status  Relation Name Status   Mother  Alive   Father  Alive   Sister  Alive   MGF  (Not Specified)   PGM  (Not Specified)   PGF  (Not Specified)   Sister  Alive   Youth worker  (Not Specified)   Family History  Problem Relation Age of Onset   Healthy Mother    Healthy Father    Healthy Sister    Heart attack Maternal Grandfather    Hyperlipidemia Maternal Grandfather    Hypertension Maternal Grandfather    Skin cancer Maternal Grandfather    Pulmonary fibrosis Maternal Grandfather    Cancer Paternal Grandmother        lung   Alcohol abuse Paternal Grandfather    Healthy Sister    Skin cancer Maternal Aunt    No Known Allergies  Patient Care Team: Melissa Ferguson, PA-C as PCP - General (Physician Assistant) Steva Ready, DO as Consulting Physician (Obstetrics and Gynecology)   Medications: Outpatient Medications  Prior to Visit  Medication Sig   COLLAGEN PO Take by mouth.   Iron Combinations (IRON COMPLEX PO) Take by mouth.   levonorgestrel (MIRENA, 52 MG,) 20 MCG/24HR IUD 1 each by Intrauterine route once.   Prenatal Vit-Fe Fumarate-FA (PRENATAL COMPLETE) 14-0.4 MG TABS Take by mouth. (Patient not taking: Reported on 08/17/2021)   No facility-administered medications prior to visit.    Review of Systems  Constitutional:  Negative for fatigue and fever.  Respiratory:  Negative for cough and shortness of breath.   Cardiovascular:  Negative for chest pain and leg swelling.  Gastrointestinal:  Negative for abdominal pain.  Neurological:  Negative for dizziness and headaches.     Objective     Blood pressure 100/73, pulse 81, height 5\' 10"  (1.778 m), weight 141 lb 1.6 oz (64 kg), SpO2 100 %, not currently breastfeeding.  BP Readings from Last 3 Encounters:  08/17/21 100/73  03/03/20 109/77  08/27/19 105/75   Wt Readings from Last 3 Encounters:  08/17/21 141 lb 1.6 oz (64 kg)  03/03/20 139 lb (63 kg)  08/27/19 142 lb 6.7 oz (64.6 kg)       Physical Exam Constitutional:      General: She is awake.     Appearance: She is well-developed. She is not ill-appearing.  HENT:     Head: Normocephalic.     Right Ear: Tympanic membrane normal.     Left Ear: Tympanic membrane normal.     Nose: Nose normal. No congestion or rhinorrhea.     Mouth/Throat:     Pharynx: No oropharyngeal exudate or posterior oropharyngeal erythema.  Eyes:     Conjunctiva/sclera: Conjunctivae normal.     Pupils: Pupils are equal, round, and reactive to light.  Neck:     Thyroid: No thyroid mass or thyromegaly.  Cardiovascular:     Rate and Rhythm: Normal rate and regular rhythm.     Heart sounds: Normal heart sounds.  Pulmonary:     Effort: Pulmonary effort is normal.     Breath sounds: Normal breath sounds.  Abdominal:     Palpations: Abdomen is soft.     Tenderness: There is no abdominal tenderness.   Musculoskeletal:     Right lower leg: No swelling. No edema.     Left lower leg: No swelling. No edema.  Lymphadenopathy:     Cervical: No cervical adenopathy.  Skin:    General: Skin is warm.  Neurological:     Mental Status: She is alert and oriented to person, place, and time.  Psychiatric:        Attention and Perception: Attention normal.        Mood and Affect: Mood normal.  Speech: Speech normal.        Behavior: Behavior normal. Behavior is cooperative.     Last depression screening scores    08/17/2021   10:48 AM 03/03/2020   10:32 AM 08/03/2018    8:57 AM  PHQ 2/9 Scores  PHQ - 2 Score 0 0 0  PHQ- 9 Score 2 1 1    Last fall risk screening    08/17/2021   10:48 AM  Fall Risk   Falls in the past year? 0  Number falls in past yr: 0  Injury with Fall? 0  Risk for fall due to : No Fall Risks   Last Audit-C alcohol use screening    08/17/2021   10:48 AM  Alcohol Use Disorder Test (AUDIT)  1. How often do you have a drink containing alcohol? 1  2. How many drinks containing alcohol do you have on a typical day when you are drinking? 0  3. How often do you have six or more drinks on one occasion? 0  AUDIT-C Score 1   A score of 3 or more in women, and 4 or more in men indicates increased risk for alcohol abuse, EXCEPT if all of the points are from question 1   No results found for any visits on 08/17/21.  Assessment & Plan    Routine Health Maintenance and Physical Exam  Exercise Activities and Dietary recommendations --balanced diet high in fiber and protein, low in sugars, carbs, fats. --physical activity/exercise 30 minutes 3-5 times a week    Immunization History  Administered Date(s) Administered   HPV Bivalent 03/10/2005, 05/07/2005, 12/17/2005, 12/27/2005   Influenza,inj,Quad PF,6+ Mos 11/05/2016, 11/04/2017   Meningococcal Conjugate 09/11/2004    Health Maintenance  Topic Date Due   Hepatitis C Screening  Never done   COVID-19 Vaccine (3  - Pfizer risk series) 05/19/2019   PAP SMEAR-Modifier  02/28/2021   INFLUENZA VACCINE  08/18/2021   TETANUS/TDAP  02/19/2029   HPV VACCINES  Completed   HIV Screening  Completed    Discussed health benefits of physical activity, and encouraged her to engage in regular exercise appropriate for her age and condition.  Problem List Items Addressed This Visit   None Visit Diagnoses     Annual physical exam    -  Primary   Relevant Orders   CBC with Differential/Platelet   Comprehensive metabolic panel   HgB A1c     No concerns no abnormalities.   Pt has upcoming appt w/ GYN for papsmear  Return in about 1 year (around 08/18/2022) for CPE.     I, 08/20/2022, PA-C have reviewed all documentation for this visit. The documentation on  08/17/2021 for the exam, diagnosis, procedures, and orders are all accurate and complete.  08/19/2021, PA-C [facility] [address] [place], [place], [place] Office: [phone] Fax: [phone]   [facility]

## 2021-08-18 LAB — CBC WITH DIFFERENTIAL/PLATELET
Basophils Absolute: 0 10*3/uL (ref 0.0–0.2)
Basos: 0 %
EOS (ABSOLUTE): 0.1 10*3/uL (ref 0.0–0.4)
Eos: 2 %
Hematocrit: 38.1 % (ref 34.0–46.6)
Hemoglobin: 12.9 g/dL (ref 11.1–15.9)
Immature Grans (Abs): 0 10*3/uL (ref 0.0–0.1)
Immature Granulocytes: 0 %
Lymphocytes Absolute: 1.8 10*3/uL (ref 0.7–3.1)
Lymphs: 29 %
MCH: 31.4 pg (ref 26.6–33.0)
MCHC: 33.9 g/dL (ref 31.5–35.7)
MCV: 93 fL (ref 79–97)
Monocytes Absolute: 0.3 10*3/uL (ref 0.1–0.9)
Monocytes: 5 %
Neutrophils Absolute: 3.9 10*3/uL (ref 1.4–7.0)
Neutrophils: 64 %
Platelets: 176 10*3/uL (ref 150–450)
RBC: 4.11 x10E6/uL (ref 3.77–5.28)
RDW: 12.1 % (ref 11.7–15.4)
WBC: 6.2 10*3/uL (ref 3.4–10.8)

## 2021-08-18 LAB — COMPREHENSIVE METABOLIC PANEL
ALT: 22 IU/L (ref 0–32)
AST: 19 IU/L (ref 0–40)
Albumin/Globulin Ratio: 2.4 — ABNORMAL HIGH (ref 1.2–2.2)
Albumin: 4.6 g/dL (ref 3.9–4.9)
Alkaline Phosphatase: 55 IU/L (ref 44–121)
BUN/Creatinine Ratio: 16 (ref 9–23)
BUN: 14 mg/dL (ref 6–20)
Bilirubin Total: 0.9 mg/dL (ref 0.0–1.2)
CO2: 23 mmol/L (ref 20–29)
Calcium: 9.4 mg/dL (ref 8.7–10.2)
Chloride: 104 mmol/L (ref 96–106)
Creatinine, Ser: 0.89 mg/dL (ref 0.57–1.00)
Globulin, Total: 1.9 g/dL (ref 1.5–4.5)
Glucose: 78 mg/dL (ref 70–99)
Potassium: 4.1 mmol/L (ref 3.5–5.2)
Sodium: 140 mmol/L (ref 134–144)
Total Protein: 6.5 g/dL (ref 6.0–8.5)
eGFR: 87 mL/min/{1.73_m2} (ref >59)

## 2021-08-18 LAB — HEMOGLOBIN A1C
Est. average glucose Bld gHb Est-mCnc: 100 mg/dL
Hgb A1c MFr Bld: 5.1 % (ref 4.8–5.6)

## 2021-08-25 DIAGNOSIS — G4489 Other headache syndrome: Secondary | ICD-10-CM | POA: Diagnosis not present

## 2021-08-25 DIAGNOSIS — M9902 Segmental and somatic dysfunction of thoracic region: Secondary | ICD-10-CM | POA: Diagnosis not present

## 2021-08-25 DIAGNOSIS — M9901 Segmental and somatic dysfunction of cervical region: Secondary | ICD-10-CM | POA: Diagnosis not present

## 2021-08-25 DIAGNOSIS — M9903 Segmental and somatic dysfunction of lumbar region: Secondary | ICD-10-CM | POA: Diagnosis not present

## 2021-09-03 DIAGNOSIS — M9901 Segmental and somatic dysfunction of cervical region: Secondary | ICD-10-CM | POA: Diagnosis not present

## 2021-09-03 DIAGNOSIS — M9903 Segmental and somatic dysfunction of lumbar region: Secondary | ICD-10-CM | POA: Diagnosis not present

## 2021-09-03 DIAGNOSIS — G4489 Other headache syndrome: Secondary | ICD-10-CM | POA: Diagnosis not present

## 2021-09-03 DIAGNOSIS — M9902 Segmental and somatic dysfunction of thoracic region: Secondary | ICD-10-CM | POA: Diagnosis not present

## 2021-09-17 DIAGNOSIS — M9902 Segmental and somatic dysfunction of thoracic region: Secondary | ICD-10-CM | POA: Diagnosis not present

## 2021-09-17 DIAGNOSIS — Z1151 Encounter for screening for human papillomavirus (HPV): Secondary | ICD-10-CM | POA: Diagnosis not present

## 2021-09-17 DIAGNOSIS — Z01419 Encounter for gynecological examination (general) (routine) without abnormal findings: Secondary | ICD-10-CM | POA: Diagnosis not present

## 2021-09-17 DIAGNOSIS — Z124 Encounter for screening for malignant neoplasm of cervix: Secondary | ICD-10-CM | POA: Diagnosis not present

## 2021-09-17 DIAGNOSIS — M9903 Segmental and somatic dysfunction of lumbar region: Secondary | ICD-10-CM | POA: Diagnosis not present

## 2021-09-17 DIAGNOSIS — M9901 Segmental and somatic dysfunction of cervical region: Secondary | ICD-10-CM | POA: Diagnosis not present

## 2021-09-17 DIAGNOSIS — G4489 Other headache syndrome: Secondary | ICD-10-CM | POA: Diagnosis not present

## 2021-10-01 DIAGNOSIS — M9903 Segmental and somatic dysfunction of lumbar region: Secondary | ICD-10-CM | POA: Diagnosis not present

## 2021-10-01 DIAGNOSIS — G4489 Other headache syndrome: Secondary | ICD-10-CM | POA: Diagnosis not present

## 2021-10-01 DIAGNOSIS — M9901 Segmental and somatic dysfunction of cervical region: Secondary | ICD-10-CM | POA: Diagnosis not present

## 2021-10-01 DIAGNOSIS — M9902 Segmental and somatic dysfunction of thoracic region: Secondary | ICD-10-CM | POA: Diagnosis not present

## 2021-10-20 DIAGNOSIS — G4489 Other headache syndrome: Secondary | ICD-10-CM | POA: Diagnosis not present

## 2021-10-20 DIAGNOSIS — M9903 Segmental and somatic dysfunction of lumbar region: Secondary | ICD-10-CM | POA: Diagnosis not present

## 2021-10-20 DIAGNOSIS — M9902 Segmental and somatic dysfunction of thoracic region: Secondary | ICD-10-CM | POA: Diagnosis not present

## 2021-10-20 DIAGNOSIS — M9901 Segmental and somatic dysfunction of cervical region: Secondary | ICD-10-CM | POA: Diagnosis not present

## 2021-11-17 DIAGNOSIS — M9902 Segmental and somatic dysfunction of thoracic region: Secondary | ICD-10-CM | POA: Diagnosis not present

## 2021-11-17 DIAGNOSIS — G4489 Other headache syndrome: Secondary | ICD-10-CM | POA: Diagnosis not present

## 2021-11-17 DIAGNOSIS — M9903 Segmental and somatic dysfunction of lumbar region: Secondary | ICD-10-CM | POA: Diagnosis not present

## 2021-11-17 DIAGNOSIS — M9901 Segmental and somatic dysfunction of cervical region: Secondary | ICD-10-CM | POA: Diagnosis not present

## 2022-07-13 ENCOUNTER — Telehealth: Payer: Self-pay | Admitting: Physician Assistant

## 2022-08-19 ENCOUNTER — Encounter: Payer: BC Managed Care – PPO | Admitting: Physician Assistant
